# Patient Record
Sex: Female | Born: 1980 | Hispanic: Yes | Marital: Married | Smoking: Never smoker
Health system: Southern US, Community
[De-identification: ages and names within clinical notes are randomized; demographics above are authoritative.]

## PROBLEM LIST (undated history)

## (undated) ENCOUNTER — Inpatient Hospital Stay (HOSPITAL_COMMUNITY): Payer: Self-pay

## (undated) DIAGNOSIS — D649 Anemia, unspecified: Secondary | ICD-10-CM

## (undated) DIAGNOSIS — E119 Type 2 diabetes mellitus without complications: Secondary | ICD-10-CM

---

## 2005-08-17 ENCOUNTER — Ambulatory Visit (HOSPITAL_COMMUNITY): Admission: RE | Admit: 2005-08-17 | Discharge: 2005-08-17 | Payer: Self-pay | Admitting: *Deleted

## 2005-10-11 ENCOUNTER — Ambulatory Visit: Payer: Self-pay | Admitting: Obstetrics & Gynecology

## 2005-10-16 ENCOUNTER — Ambulatory Visit: Payer: Self-pay | Admitting: Family Medicine

## 2005-10-17 ENCOUNTER — Ambulatory Visit: Payer: Self-pay | Admitting: *Deleted

## 2005-10-17 ENCOUNTER — Inpatient Hospital Stay (HOSPITAL_COMMUNITY): Admission: AD | Admit: 2005-10-17 | Discharge: 2005-10-19 | Payer: Self-pay | Admitting: Obstetrics & Gynecology

## 2005-11-02 ENCOUNTER — Ambulatory Visit: Payer: Self-pay | Admitting: Family Medicine

## 2005-11-02 ENCOUNTER — Ambulatory Visit: Admission: RE | Admit: 2005-11-02 | Discharge: 2005-11-02 | Payer: Self-pay | Admitting: Family Medicine

## 2006-11-25 ENCOUNTER — Ambulatory Visit: Payer: Self-pay | Admitting: Family Medicine

## 2006-11-26 ENCOUNTER — Ambulatory Visit: Payer: Self-pay | Admitting: *Deleted

## 2007-01-09 ENCOUNTER — Ambulatory Visit: Payer: Self-pay | Admitting: Family Medicine

## 2007-07-09 ENCOUNTER — Encounter (INDEPENDENT_AMBULATORY_CARE_PROVIDER_SITE_OTHER): Payer: Self-pay | Admitting: *Deleted

## 2007-07-28 ENCOUNTER — Ambulatory Visit: Payer: Self-pay | Admitting: Family Medicine

## 2007-09-08 ENCOUNTER — Ambulatory Visit: Payer: Self-pay | Admitting: Internal Medicine

## 2007-11-04 ENCOUNTER — Inpatient Hospital Stay (HOSPITAL_COMMUNITY): Admission: AD | Admit: 2007-11-04 | Discharge: 2007-11-04 | Payer: Self-pay | Admitting: Obstetrics & Gynecology

## 2007-11-05 ENCOUNTER — Encounter: Payer: Self-pay | Admitting: Obstetrics & Gynecology

## 2007-11-05 ENCOUNTER — Ambulatory Visit (HOSPITAL_COMMUNITY): Admission: AD | Admit: 2007-11-05 | Discharge: 2007-11-05 | Payer: Self-pay | Admitting: Obstetrics & Gynecology

## 2007-11-05 ENCOUNTER — Ambulatory Visit: Payer: Self-pay | Admitting: Obstetrics & Gynecology

## 2007-12-03 ENCOUNTER — Ambulatory Visit: Payer: Self-pay | Admitting: Obstetrics and Gynecology

## 2008-06-25 ENCOUNTER — Ambulatory Visit: Payer: Self-pay | Admitting: Family Medicine

## 2008-09-22 ENCOUNTER — Encounter (INDEPENDENT_AMBULATORY_CARE_PROVIDER_SITE_OTHER): Payer: Self-pay | Admitting: Obstetrics & Gynecology

## 2008-09-22 ENCOUNTER — Ambulatory Visit: Payer: Self-pay | Admitting: Obstetrics and Gynecology

## 2008-10-18 ENCOUNTER — Inpatient Hospital Stay (HOSPITAL_COMMUNITY): Admission: AD | Admit: 2008-10-18 | Discharge: 2008-10-18 | Payer: Self-pay | Admitting: Obstetrics & Gynecology

## 2009-04-06 ENCOUNTER — Ambulatory Visit: Payer: Self-pay | Admitting: Obstetrics and Gynecology

## 2009-04-08 ENCOUNTER — Ambulatory Visit (HOSPITAL_COMMUNITY): Admission: RE | Admit: 2009-04-08 | Discharge: 2009-04-08 | Payer: Self-pay | Admitting: Obstetrics & Gynecology

## 2009-04-21 ENCOUNTER — Ambulatory Visit: Payer: Self-pay | Admitting: Obstetrics and Gynecology

## 2009-10-06 ENCOUNTER — Ambulatory Visit: Payer: Self-pay | Admitting: Obstetrics and Gynecology

## 2010-09-05 ENCOUNTER — Inpatient Hospital Stay (HOSPITAL_COMMUNITY): Admission: AD | Admit: 2010-09-05 | Discharge: 2010-09-05 | Payer: Self-pay | Admitting: Obstetrics

## 2011-02-16 ENCOUNTER — Inpatient Hospital Stay (HOSPITAL_COMMUNITY)
Admission: AD | Admit: 2011-02-16 | Discharge: 2011-02-18 | DRG: 775 | Disposition: A | Discharge status: Home / Self Care | Payer: Medicaid Other | Source: Ambulatory Visit | Attending: Obstetrics | Admitting: Obstetrics

## 2011-02-16 DIAGNOSIS — O34219 Maternal care for unspecified type scar from previous cesarean delivery: Principal | ICD-10-CM | POA: Diagnosis present

## 2011-02-16 LAB — CBC
HCT: 40.8 % (ref 36.0–46.0)
Hemoglobin: 13.7 g/dL (ref 12.0–15.0)
MCH: 28.6 pg (ref 26.0–34.0)
MCHC: 33.6 g/dL (ref 30.0–36.0)
MCV: 85.2 fL (ref 78.0–100.0)
Platelets: 183 10*3/uL (ref 150–400)
RBC: 4.79 MIL/uL (ref 3.87–5.11)
RDW: 15.4 % (ref 11.5–15.5)
WBC: 8.5 10*3/uL (ref 4.0–10.5)

## 2011-02-16 LAB — RPR: RPR Ser Ql: NONREACTIVE

## 2011-02-17 LAB — CBC
HCT: 33.5 % — ABNORMAL LOW (ref 36.0–46.0)
Hemoglobin: 10.8 g/dL — ABNORMAL LOW (ref 12.0–15.0)
MCH: 28 pg (ref 26.0–34.0)
MCHC: 32.2 g/dL (ref 30.0–36.0)
MCV: 86.8 fL (ref 78.0–100.0)
Platelets: 157 10*3/uL (ref 150–400)
RBC: 3.86 MIL/uL — ABNORMAL LOW (ref 3.87–5.11)
RDW: 16.1 % — ABNORMAL HIGH (ref 11.5–15.5)
WBC: 9.5 10*3/uL (ref 4.0–10.5)

## 2011-03-06 NOTE — Group Therapy Note (Signed)
NAME:  Christine Roach, Christine Roach      ACCOUNT NO.:  [id number]   MEDICAL RECORD NO.:  [id number]          PATIENT TYPE:  WOC   LOCATION:  [facility]                   FACILITY:  [facility]   PHYSICIAN:  Johnella Moloney, MD        DATE OF BIRTH:  December 31, 1980   DATE OF SERVICE:  12/03/2007                                  CLINIC NOTE   CHIEF COMPLAINT:  Follow up D&E.   HISTORY OF PRESENT ILLNESS:  This is a 30 year old female, who had a D&E  performed on November 05, 2007, for a known fetal demise incomplete at 12  weeks.  Patient has done well since this time.  She is no longer having  any vaginal bleeding.  She is no longer having abdominal pain.  She  feels that she has recovered completely and has no questions or  concerns.  The patient does also complain of left breast pain.  She says  she feels like there are little bumps on her left breast.  Per her  verbal report, she had a normal mammogram as well as a normal  ultrasound.  The patient states that the pain comes and goes and is not  always present.  No discharge and no redness of the area, she says.  She  is very worried about this.   PHYSICAL EXAMINATION:  VITAL SIGNS:  Temperature 97.7, pulse 71, blood  pressure 113/78, weight is 158.  GENERAL EXAM:  Not in acute distress.  ABDOMINAL EXAM:  Soft and nontender, positive bowel sounds.  BREAST EXAM:  Her left breast has an area of moderate tenderness to  palpation in the inner outer quadrant.  There are no masses, no cysts,  no bumps, and no lesions present.  The area is not indurated nor  fluctuant.  There is no erythema present.  No other areas of tenderness  noted or no other areas of concern.  GU EXAM:  There were no lesions of the vagina or cervix.  There is mild  spotting at the os of the cervix.  Bimanual exam showed a nontender  cervix, a nontender uterus, no masses present, and no adnexal masses  present either.   ASSESSMENT:  This is a 30 year old female for follow up dilation  and  evacuation (D&E) in the office complaining of breast pain.   PLAN:  1. Follow up D&E:  The patient has done well status post her D&E.  She      should refrain from sexual intercourse for an additional two more      weeks for a total of six weeks.  She can follow up p.r.n.  2. Breast pain:  Per verbal report, there was a normal imaging;      however, we do not have these and cannot obtain these records at      this time.  The patient seems to be very worried about this breast      pain and it is present all      through the physical exam.  Therefore, we will refer her to the      [facility] where the surgeons can work up and do any  further imaging and biopsies or studies as needed.      Johney Maine, MD    ______________________________  Johnella Moloney, MD    JT/MEDQ  D:  12/03/2007  T:  12/04/2007  Job:  [id number]

## 2011-03-06 NOTE — Op Note (Signed)
NAME:  Christine Roach, Christine Roach      ACCOUNT NO.:  [id number]   MEDICAL RECORD NO.:  [id number]          PATIENT TYPE:  AMB   LOCATION:  [facility]                          [facility]   PHYSICIAN:  Norton Blizzard, MD    DATE OF BIRTH:  February 08, 1981   DATE OF PROCEDURE:  11/05/2007  DATE OF DISCHARGE:                               OPERATIVE REPORT   PREOPERATIVE DIAGNOSIS:  Known fetal demise at 12 weeks, incomplete  miscarriage.   POSTOPERATIVE DIAGNOSIS:  Known fetal demise at 12 weeks, incomplete  miscarriage.   OPERATION PERFORMED:  Suction dilation and curettage.   SURGEON:  Norton Blizzard, MD   ANESTHESIA:  Spinal.   IV FLUIDS:  800 mL lactated Ringers.   URINE OUTPUT:  Copious amount of clear yellow urine, unmeasured.   ESTIMATED BLOOD LOSS:  200 mL.   INDICATIONS FOR PROCEDURE:  The patient is a 30 year old G3, P2-0-0-2  who initially presented on November 04, 2007 to the [facility] for evaluation of  bleeding in early pregnancy.  The patient underwent an ultrasound which  showed a missed abortion at 12 weeks.  She was counseled regarding her  options.  The patient opted for dilation and evacuation which was  subsequently scheduled on January 15.  At this point the patient had no  bleeding or any further concerns.  However, early on the morning of  November 05, 2007, the patient represented to [facility] with moderate amount of  bleeding.  On exam she has stable vital signs but was noted to have a  moderate amount of products in her vaginal vault and also significant  bleeding.  Given this finding, decision was made to proceed with  dilation and evacuation.  The risks of surgery were explained to the  patient using the aid of a Spanish interpreter and written informed  consent was obtained.   FINDINGS:  A 12 week size uterus.  Moderate amount of products of  conception in patient's vaginal vault and uterine cavity.   COMPLICATIONS:  None.   DESCRIPTION OF PROCEDURE:  The patient  was taken to the operating room  where spinal anesthesia was placed and found to be adequate.  Of note  the patient had received a preoperative antibiotic dose of Doxycycline  100 mg dose intravenously.  The patient was then placed in the dorsal  lithotomy position, and prepped and draped in sterile manner.  A red  rubber catheter was used to catheterize the patient's bladder for a  copious amount of clear yellow urine.  Attention was then turned to the  patient's vagina where a speculum was placed and a paracervical block  was administered using 0.25% Marcaine for a total of 20 mL.  At this  point there was noted to be a moderate amount of products of conception  in the vagina which were removed.  The cervical os was noted to be  dilated to about 1 cm, and a 12 mm suction curette was advanced gently  to the uterine fundus.  The suction device was then activated and the  curette was slowly rotated to clear the contents of the uterine cavity.  Sharp curettage was then done afterwards to confirm complete evacuation.  The suction curette was then inserted one more time to help in clearing  up the remaining clots and debris.  Overall good hemostasis was noted.  All instruments were then removed from the patient's vagina.  The  patient tolerated the procedure well.  Sponge and instrument counts were  correct x 2.  She was taken to the recovery room in stable condition.      Norton Blizzard, MD  Electronically Signed     UAD/MEDQ  D:  11/05/2007  T:  11/05/2007  Job:  [id number]

## 2011-03-06 NOTE — Group Therapy Note (Signed)
NAME:  Christine Roach, Christine Roach      ACCOUNT NO.:  [id number]   MEDICAL RECORD NO.:  [id number]          PATIENT TYPE:  WOC   LOCATION:  [facility]                   [facility]ACILITY:  [facility]   PHYSICIAN:  Dorthula Perfect, MD     DATE OF BIRTH:  [birth year]   DATE OF SERVICE:  09/22/2008                                  CLINIC NOTE   CHIEF COMPLAINT:  Irregular bleeding and possible endocervical polyp  seen on ultrasound.   HISTORY OF PRESENT ILLNESS:  This is a 30 year old female who presents  for the following two problems.  1. Irregular bleeding.  The patient reports that since menarche she      has had irregular periods ranging from 1 every month to 1 every 3      months in an unpredictable fashion.  The periods last 3 days and      are light.  She denies any spotting or bleeding between periods.      She is using condoms for birth control.  She has never used oral      contraceptive pills before.  She was given Provera for 5 days to      induce a withdrawal bleed approximately 3 months ago.  This was      successful and she had a period every 30 days for 2 more cycles,      her last menstrual period being August 10, 2008.  However, she      again has missed her period for November.  A pregnancy test here is      negative.  2. Possible endocervical polyp or fibroids.  In August, the patient      had a transabdominal and transvaginal pelvic ultrasound that showed      a hyperechoic nodular structure, which appears to be arising from      the cervical wall and may be extending within the endocervical      canal measuring 1.6 cm in greatest dimension.  Possibility of      endocervical polyp or fibroid was raised.  The patient was sent for      gynecologic evaluation.   PAST MEDICAL HISTORY:  The patient is a G3, P2-0-1-2.  She had 1 C-  section for her first pregnancy and 1 vaginal delivery.  She had a  missed abortion followed by a D and C in February of 2009.   PAST SURGICAL HISTORY:  1.  C-section.  2. D and C in February 2009 following missed abortion.   CURRENT MEDICATIONS:  None.   ALLERGIES:  No known drug allergies.   PHYSICAL EXAM:  VITAL SIGNS:  Temperature 97.3, pulse 79, blood pressure  114/76.  GENERAL:  The patient is in no acute distress, alert and oriented x3.  PELVIC:  Normal external genitalia, normal vaginal mucosa, pink and  moist without discharge.  Normal-appearing cervix with closed os.  No  cervical lesions.  Uterus is slightly reflexed and of normal size.  No  blood.  No polyp is visualized.  The uterus is enlarged.  The cervix and  uterus are nontender.   ASSESSMENT AND PLAN:  This is a 30 year old female who presents with  dysfunctional uterine bleeding and possible endocervical polyp.   PROBLEMS:  1. Dysfunctional uterine bleeding.  The patient's menstrual cycles      have been irregular since menarche.  She likely has elements of      polycystic ovarian syndrome, indicating she is not regularly      ovulating.  Her possible endocervical polyp is unrelated to her      irregular cycles.  The patient's options of oral contraceptive      pills versus using Provera for withdrawal bleeding every 2 months      is discussed, and the patient would prefer to continue to use      Provera.  She will take 10 mg of Provera for 5 days if she toes 2      months or more without having a menstrual cycle.  She will follow      up in 6 months.  2. Possible endocervical polyp.  Based on ultrasound findings and      normal physical exam, this is unlikely a worrisome finding and I      have decided to follow up in 6 months for repeat exam and possible      repeat ultrasound.  She is to return sooner if she has any pain or      bleeding.  3. Additionally, the patient's Pap smear was performed today.   An interpreter was used for this visit.     ______________________________  Dorthula Perfect, MD    ______________________________  Dorthula Perfect,  MD    ER/MEDQ  D:  09/22/2008  T:  09/22/2008  Job:  [id number]

## 2011-03-09 NOTE — Group Therapy Note (Signed)
NAME:  Christine Roach, FOGLESONG NO.:  [id number]   MEDICAL RECORD NO.:  [id number]          PATIENT TYPE:  WOC   LOCATION:  [facility]                   FACILITY:  [facility]   PHYSICIAN:  Tinnie Gens, MD        DATE OF BIRTH:  01/26/1981   DATE OF SERVICE:                                    CLINIC NOTE   CHIEF COMPLAINT:  Some one called and told me to come in today.   HISTORY OF PRESENT ILLNESS:  Patient is a 30 year old Hispanic patient, who  delivered a female infant on October 17, 2005.  She is nursing, and she said  someone called her on Monday, and told her come in today.  The patient has  complaints of fever, chills, and breast tenderness.  She has got some  erythema on the left breast, and reports that her breasts are achy with, or  without the baby nursing.  She reports that the baby is  feeding about 25  minutes on both sides.  The patient was leaking quite a lot of breast milk.  She thinks she was also here for a Pap smear today.  However, she is only  three weeks postpartum.   PHYSICAL EXAMINATION:  VITAL SIGNS:  She is afebrile.  Temp is 97, blood  pressure is 112/68.  GENERAL: She is a well-developed, well-nourished Hispanic female in no acute  distress.  BREASTS:  Have nipples that are bright pink.  They appear to be __________,  blocked ducts in the left in the outer quadrant.  She also has some streaky  erythema on that left breast as well.  The right breast looks fairly normal.   New exam on the infant, there does appear to be some thrush around the gums  on the inferior surfaces.  The infant was seen by his physician on October 28, 2005.   IMPRESSION:  Mastitis, probable fungal, as well as bacterial.   PLAN:  1.  Keflex 500 mg p.o. t.i.d. x10 days.  2.  Diflucan 100 mg p.o. daily x14 days.  3.  We will have the patient seen by Lactation today at 2:30, and watch her      feed, and see if they can do any correction and aid in her breastfeeding  experience.           ______________________________  Tinnie Gens, MD     TP/MEDQ  D:  11/02/2005  T:  11/03/2005  Job:  [id number]

## 2011-07-12 LAB — CBC
HCT: 38.7
Hemoglobin: 13.5
MCHC: 34.8
MCV: 85.9
Platelets: 254
RBC: 4.5
RDW: 13.9
WBC: 8.1

## 2011-07-12 LAB — URINALYSIS, ROUTINE W REFLEX MICROSCOPIC
Bilirubin Urine: NEGATIVE
Glucose, UA: NEGATIVE
Ketones, ur: NEGATIVE
Leukocytes, UA: NEGATIVE
Nitrite: NEGATIVE
Protein, ur: NEGATIVE
Specific Gravity, Urine: <1.005 — ABNORMAL LOW
Urobilinogen, UA: 0.2
pH: 7.5

## 2011-07-12 LAB — GC/CHLAMYDIA PROBE AMP, GENITAL
Chlamydia, DNA Probe: NEGATIVE
GC Probe Amp, Genital: NEGATIVE

## 2011-07-12 LAB — URINE MICROSCOPIC-ADD ON

## 2011-07-12 LAB — WET PREP, GENITAL
Clue Cells Wet Prep HPF POC: NONE SEEN
Trich, Wet Prep: NONE SEEN
Yeast Wet Prep HPF POC: NONE SEEN

## 2011-07-12 LAB — ABO/RH: ABO/RH(D): O POS

## 2011-07-12 LAB — POCT PREGNANCY, URINE
Operator id: 280671
Preg Test, Ur: POSITIVE

## 2011-07-12 LAB — HCG, QUANTITATIVE, PREGNANCY: hCG, Beta Chain, Quant, S: 1394 — ABNORMAL HIGH

## 2011-07-27 LAB — POCT PREGNANCY, URINE: Preg Test, Ur: NEGATIVE

## 2011-10-23 HISTORY — PX: DILATION AND CURETTAGE OF UTERUS: SHX78

## 2012-05-18 ENCOUNTER — Emergency Department (HOSPITAL_COMMUNITY)
Admission: EM | Admit: 2012-05-18 | Discharge: 2012-05-18 | Disposition: A | Discharge status: Home / Self Care | Payer: Self-pay | Attending: Emergency Medicine | Admitting: Emergency Medicine

## 2012-05-18 ENCOUNTER — Encounter (HOSPITAL_COMMUNITY): Payer: Self-pay | Admitting: *Deleted

## 2012-05-18 ENCOUNTER — Emergency Department (HOSPITAL_COMMUNITY): Payer: Self-pay

## 2012-05-18 DIAGNOSIS — R1013 Epigastric pain: Secondary | ICD-10-CM | POA: Insufficient documentation

## 2012-05-18 DIAGNOSIS — M25519 Pain in unspecified shoulder: Secondary | ICD-10-CM | POA: Insufficient documentation

## 2012-05-18 DIAGNOSIS — O2690 Pregnancy related conditions, unspecified, unspecified trimester: Secondary | ICD-10-CM | POA: Insufficient documentation

## 2012-05-18 DIAGNOSIS — K8020 Calculus of gallbladder without cholecystitis without obstruction: Secondary | ICD-10-CM | POA: Insufficient documentation

## 2012-05-18 DIAGNOSIS — M549 Dorsalgia, unspecified: Secondary | ICD-10-CM

## 2012-05-18 LAB — LIPASE, BLOOD: Lipase: 30 U/L (ref 11–59)

## 2012-05-18 LAB — CBC WITH DIFFERENTIAL/PLATELET
Basophils Absolute: 0 10*3/uL (ref 0.0–0.1)
Basophils Relative: 0 % (ref 0–1)
Eosinophils Absolute: 0.1 10*3/uL (ref 0.0–0.7)
Eosinophils Relative: 1 % (ref 0–5)
HCT: 41.6 % (ref 36.0–46.0)
Hemoglobin: 14.5 g/dL (ref 12.0–15.0)
Lymphocytes Relative: 22 % (ref 12–46)
Lymphs Abs: 2 10*3/uL (ref 0.7–4.0)
MCH: 28.7 pg (ref 26.0–34.0)
MCHC: 34.9 g/dL (ref 30.0–36.0)
MCV: 82.2 fL (ref 78.0–100.0)
Monocytes Absolute: 0.5 10*3/uL (ref 0.1–1.0)
Monocytes Relative: 5 % (ref 3–12)
Neutro Abs: 6.6 10*3/uL (ref 1.7–7.7)
Neutrophils Relative %: 72 % (ref 43–77)
Platelets: 266 10*3/uL (ref 150–400)
RBC: 5.06 MIL/uL (ref 3.87–5.11)
RDW: 13.9 % (ref 11.5–15.5)
WBC: 9.2 10*3/uL (ref 4.0–10.5)

## 2012-05-18 LAB — BASIC METABOLIC PANEL
BUN: 8 mg/dL (ref 6–23)
CO2: 24 mEq/L (ref 19–32)
Calcium: 9.4 mg/dL (ref 8.4–10.5)
Chloride: 99 mEq/L (ref 96–112)
Creatinine, Ser: 0.5 mg/dL (ref 0.50–1.10)
GFR calc Af Amer: >90 mL/min (ref >90)
GFR calc non Af Amer: >90 mL/min (ref >90)
Glucose, Bld: 105 mg/dL — ABNORMAL HIGH (ref 70–99)
Potassium: 3.6 mEq/L (ref 3.5–5.1)
Sodium: 136 mEq/L (ref 135–145)

## 2012-05-18 LAB — HEPATIC FUNCTION PANEL
ALT: 13 U/L (ref 0–35)
Alkaline Phosphatase: 56 U/L (ref 39–117)
Bilirubin, Direct: <0.1 mg/dL (ref 0.0–0.3)
Total Bilirubin: 0.2 mg/dL — ABNORMAL LOW (ref 0.3–1.2)

## 2012-05-18 LAB — URINALYSIS, ROUTINE W REFLEX MICROSCOPIC
Bilirubin Urine: NEGATIVE
Hgb urine dipstick: NEGATIVE
Protein, ur: NEGATIVE mg/dL
Urobilinogen, UA: 0.2 mg/dL (ref 0.0–1.0)

## 2012-05-18 LAB — POCT I-STAT TROPONIN I: Troponin i, poc: 0 ng/mL (ref 0.00–0.08)

## 2012-05-18 LAB — WET PREP, GENITAL
Clue Cells Wet Prep HPF POC: NONE SEEN
Trich, Wet Prep: NONE SEEN

## 2012-05-18 MED ORDER — ONDANSETRON HCL 4 MG/2ML IJ SOLN
4.0000 mg | Freq: Once | INTRAMUSCULAR | Status: AC
  Administered 2012-05-18: 4 mg via INTRAVENOUS
  Filled 2012-05-18: qty 2

## 2012-05-18 MED ORDER — PRENATAL COMPLETE 14-0.4 MG PO TABS: 1.0000 | ORAL_TABLET | Freq: Every day | ORAL | Status: DC

## 2012-05-18 MED ORDER — SODIUM CHLORIDE 0.9 % IV BOLUS (SEPSIS)
1000.0000 mL | Freq: Once | INTRAVENOUS | Status: AC
  Administered 2012-05-18: 1000 mL via INTRAVENOUS

## 2012-05-18 NOTE — ED Notes (Signed)
Patient transported back from ultrasound.

## 2012-05-18 NOTE — ED Provider Notes (Signed)
History     CSN: [id number]  Arrival date & time 05/18/12  0102   First MD Initiated Contact with Patient 05/18/12 0146      Chief Complaint  Patient presents with  . Back Pain    (Consider location/radiation/quality/duration/timing/severity/associated sxs/prior treatment) HPI Comments: Patient presents with right subscapular shoulder pain, right abdominal pain and epigastric pain for the past 3 hours. Associated with nausea. Patient states she is [redacted] weeks pregnant by dates and has not had a confirmatory ultrasound. Denies any fever, chills, vomiting, chest pain or shortness of breath. The pain is underneath her right ribs and anterior sternum and radiates up to her right scapula. She denies any change in bowel habits. No vaginal bleeding, discharge, dysuria or hematuria.  The history is provided by the patient and the spouse.    History reviewed. No pertinent past medical history.  History reviewed. No pertinent past surgical history.  History reviewed. No pertinent family history.  History  Substance Use Topics  . Smoking status: Never Smoker   . Smokeless tobacco: Not on file  . Alcohol Use: No    OB History    Grav Para Term Preterm Abortions TAB SAB Ect Mult Living   1               Review of Systems  Constitutional: Negative for activity change and appetite change.  HENT: Negative for congestion and rhinorrhea.   Respiratory: Negative for cough, chest tightness and shortness of breath.   Cardiovascular: Negative for chest pain.  Gastrointestinal: Positive for nausea and abdominal pain. Negative for vomiting, diarrhea and constipation.  Genitourinary: Negative for dysuria, vaginal bleeding and vaginal discharge.  Musculoskeletal: Positive for back pain.  Skin: Negative for rash.  Neurological: Negative for dizziness, weakness and headaches.    Allergies  Review of patient's allergies indicates no known allergies.  Home Medications   Current Outpatient Rx    Name Route Sig Dispense Refill  . IBUPROFEN 200 MG PO TABS Oral Take 200 mg by mouth every 6 (six) hours as needed. For pain      BP 102/53  Pulse 63  Temp 98.4 F (36.9 C) (Oral)  Resp 18  SpO2 99%  LMP 03/17/2012  Physical Exam  Constitutional: She is oriented to person, place, and time. She appears well-developed and well-nourished. No distress.  HENT:  Head: Normocephalic and atraumatic.  Mouth/Throat: Oropharynx is clear and moist. No oropharyngeal exudate.  Eyes: Conjunctivae and EOM are normal. Pupils are equal, round, and reactive to light.  Neck: Normal range of motion. Neck supple.  Cardiovascular: Normal rate, regular rhythm and normal heart sounds.   Pulmonary/Chest: Effort normal and breath sounds normal. No respiratory distress.  Abdominal: Soft. There is tenderness.       Right upper quadrant epigastric pain  Genitourinary: There is no tenderness on the right labia. There is no tenderness on the left labia. Cervix exhibits no motion tenderness and no discharge. Right adnexum displays no mass and no tenderness. Left adnexum displays no mass and no tenderness. No vaginal discharge found.  Musculoskeletal: Normal range of motion. She exhibits tenderness.       Right-sided thoracic tenderness and subscapular tenderness. Full range of motion of the right shoulder without pain  Neurological: She is alert and oriented to person, place, and time. No cranial nerve deficit.  Skin: Skin is warm.    ED Course  Procedures (including critical care time)  Labs Reviewed  BASIC METABOLIC PANEL - Abnormal; Notable for  the following:    Glucose, Bld 105 (*)     All other components within normal limits  URINALYSIS, ROUTINE W REFLEX MICROSCOPIC - Abnormal; Notable for the following:    APPearance CLOUDY (*)     pH 8.5 (*)     All other components within normal limits  HCG, SERUM, QUALITATIVE - Abnormal; Notable for the following:    Preg, Serum POSITIVE (*)     All other  components within normal limits  PREGNANCY, URINE - Abnormal; Notable for the following:    Preg Test, Ur POSITIVE (*)     All other components within normal limits  HCG, QUANTITATIVE, PREGNANCY - Abnormal; Notable for the following:    hCG, Beta Chain, Quant, [place] [address] (*)     All other components within normal limits  HEPATIC FUNCTION PANEL - Abnormal; Notable for the following:    Total Bilirubin 0.2 (*)     All other components within normal limits  CBC WITH DIFFERENTIAL  POCT I-STAT TROPONIN I  LIPASE, BLOOD  ABO/RH  D-DIMER, QUANTITATIVE  GC/CHLAMYDIA PROBE AMP, GENITAL  WET PREP, GENITAL   US Abdomen Complete  05/18/2012  *RADIOLOGY REPORT*  Clinical Data:  Right upper quadrant pain.  COMPLETE ABDOMINAL ULTRASOUND  Comparison:  None.  Findings:  Gallbladder:  Cholelithiasis.  No gallbladder wall thickening or pericholecystic fluid.  Negative sonographic Murphy's sign.  Common bile duct:  Measures 3 mm.  Within normal limits.  Liver:  Mild heterogeneous echogenicity without focal abnormality identified.  IVC:  Appears normal.  Pancreas:  Poorly visualized due to limited acoustic windows.  No focal abnormality identified within the head/neck.  The body and tail are obscured.  Spleen:  Measures 6.4 cm oblique.  No focal abnormality.  Right Kidney:  Measures 12.6 cm.  No hydronephrosis or focal abnormality.  Left Kidney:  Measures 13.0 cm.  No hydronephrosis or focal abnormality.  Abdominal aorta:  Proximal aorta and bifurcation are obscured by overlying artifact.  Where seen, measures up to 1.7 cm.  IMPRESSION: Cholelithiasis without sonographic evidence for cholecystitis.  Heterogeneous liver echogenicity suggests steatosis.  Original Report Authenticated By: Waneta Martins, M.D.   US Ob Comp Less 14 Wks  05/18/2012  *RADIOLOGY REPORT*  Clinical Data: Pregnant, abdominal pain.  OBSTETRIC <14 WK [place] AND TRANSVAGINAL OB US  Technique:  Both transabdominal and transvaginal ultrasound  examinations were performed for complete evaluation of the gestation as well as the maternal uterus, adnexal regions, and pelvic cul-de-sac.  Transvaginal technique was performed to assess early pregnancy.  Comparison:  None.  Intrauterine gestational sac:  Visualized/normal in shape. Yolk sac: Identified Embryo: Identified Cardiac Activity: Identified Heart Rate: 158 bpm  CRL: 15  mm  7 w  6 d         [place] EDC: 12/29/2012  Maternal uterus/adnexae: No subchorionic hemorrhage.  Left ovary not visualized.  Numerous small cysts along the periphery of the right ovary, in a pattern that can be seen with polycystic ovarian syndrome.  IMPRESSION: Single intrauterine gestation with cardiac activity documented. Estimated age of 7 weeks 6 days by crown-rump length.  Multiple small follicles along the periphery of the right ovary is nonspecific, though can be seen with polycystic ovarian syndrome.  Original Report Authenticated By: Waneta Martins, M.D.   US Ob Transvaginal  05/18/2012  *RADIOLOGY REPORT*  Clinical Data: Pregnant, abdominal pain.  OBSTETRIC <14 WK [place] AND TRANSVAGINAL OB US  Technique:  Both transabdominal and transvaginal ultrasound examinations were performed  for complete evaluation of the gestation as well as the maternal uterus, adnexal regions, and pelvic cul-de-sac.  Transvaginal technique was performed to assess early pregnancy.  Comparison:  None.  Intrauterine gestational sac:  Visualized/normal in shape. Yolk sac: Identified Embryo: Identified Cardiac Activity: Identified Heart Rate: 158 bpm  CRL: 15  mm  7 w  6 d         [place] EDC: 12/29/2012  Maternal uterus/adnexae: No subchorionic hemorrhage.  Left ovary not visualized.  Numerous small cysts along the periphery of the right ovary, in a pattern that can be seen with polycystic ovarian syndrome.  IMPRESSION: Single intrauterine gestation with cardiac activity documented. Estimated age of 7 weeks 6 days by crown-rump length.  Multiple small  follicles along the periphery of the right ovary is nonspecific, though can be seen with polycystic ovarian syndrome.  Original Report Authenticated By: Waneta Martins, M.D.     No diagnosis found.    MDM  Patient arrives with right shoulder pain, right upper quadrant pain and epigastric pain for the past 3 hours. She is [redacted] weeks pregnant by date. She admits to nausea.  Concern for gallbladder pathology. Less likely PE.  No tachycardia or hypoxia.    Cholelithiasis without cholecystitis on ultrasound. LFTs and lipase within normal limits. IUP confirmed on ultrasound with appropriate fetal heart tones.  D-dimer negative. No tachycardia, no hypoxia to suggest pulmonary embolus. Back pain as well as abdominal pain completely resolved during ED stay. Followup with women's clinic.  Prenatal vitamins started.  Patient advised to discontinue any NSAID use.   Date: 05/18/2012  Rate: 67  Rhythm: normal sinus rhythm  QRS Axis: normal  Intervals: normal  ST/T Wave abnormalities: normal  Conduction Disutrbances:none  Narrative Interpretation:   Old EKG Reviewed: none available    Glynn Octave, MD 05/18/12 [id number]

## 2012-05-18 NOTE — ED Notes (Signed)
Pt states that she has upper back pain. Pt states the pain started an hour ago. Pt states upper abdominal pain. Pt is approx. [redacted] weeks pregnant. Pt states that she took ibuprofen for the pain with no relief. Pt states nauseated.

## 2012-05-20 LAB — GC/CHLAMYDIA PROBE AMP, GENITAL
Chlamydia, DNA Probe: NEGATIVE
GC Probe Amp, Genital: NEGATIVE

## 2012-07-08 ENCOUNTER — Ambulatory Visit (HOSPITAL_COMMUNITY)
Admission: RE | Admit: 2012-07-08 | Discharge: 2012-07-08 | Disposition: A | Discharge status: Home / Self Care | Payer: Self-pay | Source: Ambulatory Visit | Attending: Obstetrics | Admitting: Obstetrics

## 2012-07-08 ENCOUNTER — Other Ambulatory Visit (HOSPITAL_COMMUNITY): Payer: Self-pay | Admitting: Obstetrics

## 2012-07-08 ENCOUNTER — Ambulatory Visit (HOSPITAL_COMMUNITY): Payer: Self-pay

## 2012-07-08 DIAGNOSIS — O368390 Maternal care for abnormalities of the fetal heart rate or rhythm, unspecified trimester, not applicable or unspecified: Secondary | ICD-10-CM | POA: Insufficient documentation

## 2012-07-08 DIAGNOSIS — O3680X0 Pregnancy with inconclusive fetal viability, not applicable or unspecified: Secondary | ICD-10-CM

## 2012-07-09 ENCOUNTER — Inpatient Hospital Stay (HOSPITAL_COMMUNITY)
Admission: AD | Admit: 2012-07-09 | Discharge: 2012-07-09 | DRG: 779 | Disposition: A | Discharge status: Home / Self Care | Payer: Medicaid Other | Source: Ambulatory Visit | Attending: Obstetrics | Admitting: Obstetrics

## 2012-07-09 DIAGNOSIS — O021 Missed abortion: Principal | ICD-10-CM | POA: Diagnosis present

## 2012-07-09 LAB — MRSA PCR SCREENING: MRSA by PCR: NEGATIVE

## 2012-07-09 LAB — CBC
Hemoglobin: 12.9 g/dL (ref 12.0–15.0)
MCHC: 34 g/dL (ref 30.0–36.0)
RDW: 14.8 % (ref 11.5–15.5)

## 2012-07-09 MED ORDER — SODIUM CHLORIDE 0.9 % IJ SOLN: 9.0000 mL | INTRAMUSCULAR | Status: DC | PRN

## 2012-07-09 MED ORDER — MORPHINE SULFATE (PF) 1 MG/ML IV SOLN
INTRAVENOUS | Status: DC
  Administered 2012-07-09: 11:00:00 via INTRAVENOUS
  Administered 2012-07-09: 3 mg via INTRAVENOUS
  Administered 2012-07-09: 10.5 mg via INTRAVENOUS
  Filled 2012-07-09: qty 25

## 2012-07-09 MED ORDER — DIPHENHYDRAMINE HCL 50 MG/ML IJ SOLN: 12.5000 mg | Freq: Four times a day (QID) | INTRAMUSCULAR | Status: DC | PRN

## 2012-07-09 MED ORDER — MISOPROSTOL 200 MCG PO TABS
800.0000 ug | ORAL_TABLET | Freq: Once | ORAL | Status: AC
  Administered 2012-07-09: 800 ug via RECTAL

## 2012-07-09 MED ORDER — MISOPROSTOL 200 MCG PO TABS
600.0000 ug | ORAL_TABLET | Freq: Four times a day (QID) | ORAL | Status: DC
  Administered 2012-07-09: 600 ug via VAGINAL
  Filled 2012-07-09: qty 4
  Filled 2012-07-09: qty 3

## 2012-07-09 MED ORDER — DIPHENHYDRAMINE HCL 12.5 MG/5ML PO ELIX
12.5000 mg | ORAL_SOLUTION | Freq: Four times a day (QID) | ORAL | Status: DC | PRN
  Filled 2012-07-09: qty 5

## 2012-07-09 MED ORDER — LACTATED RINGERS IV SOLN
INTRAVENOUS | Status: DC
  Administered 2012-07-09: 10:00:00 via INTRAVENOUS

## 2012-07-09 MED ORDER — ONDANSETRON HCL 4 MG/2ML IJ SOLN: 4.0000 mg | Freq: Four times a day (QID) | INTRAMUSCULAR | Status: DC | PRN

## 2012-07-09 MED ORDER — NALOXONE HCL 0.4 MG/ML IJ SOLN: 0.4000 mg | INTRAMUSCULAR | Status: DC | PRN

## 2012-07-09 NOTE — H&P (Signed)
This is Dr. Francoise Ceo dictating the history and physical on  Christine Roach she's a 31 year old gravida 5 para 3013 she had one C-section in the past and her EDC by ultrasound 12/29/2012 making her [redacted] weeks pregnant she was first seen on 06/11/2011 weeks and at that time with a fetal heart 160 yesterday no fetal heart was noted on ultrasound confirmed an IUFD at 15 weeks lap medium-sized laminae Y. Was inserted in her cervix and that was replaced removed this morning Past medical history negative Past surgical history C-section x1 Social history negative System review noncontributory Physical exam revealed a well-developed female in no distress HEENT negative Breasts negative Heart regular rhythm no murmurs or gallops Lungs clear to P&A Abdomen negative uterus 16 weeks size and Cervix slightly old him to laminae rose removed Extremities negative and

## 2012-07-09 NOTE — Discharge Summary (Signed)
  Patient was admitted this morning at 16 weeks with intrauterine fetal demise and had a him in RES today this was removed and 600 mics of Cytotec was inserted vagina she passed the fetus at 1 PM and and 800 mics of Cytotec placed per rectum and she passed the placenta at 1:45 PM her bleeding is minimal no pain and she been discharged today

## 2012-07-09 NOTE — Progress Notes (Signed)
07/09/12 1100  Clinical Encounter Type  Visited With Patient and family together Armed forces training and education officer, interpreter)  Visit Type Spiritual support;Social support  Referral From Nurse [facility] Johnnye Sima)  Spiritual Encounters  Spiritual Needs Emotional;Grief support  Stress Factors  Patient Stress Factors Loss (miscarriage)    With interpreter Eda's assistance, met Christine Roach and her husband to introduce myself and to offer condolences and chaplain support as desired through their stay here.  They were quiet and also appreciative.  Assisted Eda and RN Sharyl Nimrod with initial calls re cremation service per pt request.  Assembled Comfort materials for nurse to provide to family as appropriate.  Family did not desire lengthy conversation at this time, but is aware of ongoing chaplain availability.  [address] [place], [place] [phone]

## 2012-07-09 NOTE — Progress Notes (Signed)
Pt. Delivered products of conception at 1300. Dr. Gaynell Face, interpreter, and chaplain notified. After Dr. Gaynell Face performed his exam, I administered of Cytotec rectally per MD order. Pt. Delivered placenta at 1345. Several clots were present. Fundus is midline, firm and 3 below the umbilicus. Emotional support provided to pt. And her husband. Pt. Held the baby for awhile, and was emotional and appropriate. Pt. And husband declined having pictures taken and footprints taken of the baby. Will provide resources. Plan for family to pick up baby tomorrow to take to a funeral. Pt. Wants to go home today. Dr. Gaynell Face stated he would come evaluate pt. This afternoon. Will continue to monitor.

## 2012-07-09 NOTE — Progress Notes (Signed)
Pt. Is stable and ready to discharge home. Discharge instructions were given via interpreter and pt. Verbalized understanding of instructions. Pt. Was walked out by RN and husband took her home. All of belongings and RX was given to her.

## 2012-07-09 NOTE — Progress Notes (Signed)
Patienpatient passed fetus at 1 PM and received 800 mics of Cytotec per rectum and passed the placenta at 8 at 1:45 PM there is no minimal bleeding and the patient will be discharged todayt ID: Christine Roach, female   DOB: 01/23/1981, 31 y.o.   MRN: [id number]

## 2012-07-10 NOTE — Progress Notes (Signed)
Post discharge chart review completed.  

## 2012-08-23 ENCOUNTER — Emergency Department (HOSPITAL_COMMUNITY)
Admission: EM | Admit: 2012-08-23 | Discharge: 2012-08-23 | Disposition: A | Discharge status: Home / Self Care | Payer: Self-pay | Attending: Emergency Medicine | Admitting: Emergency Medicine

## 2012-08-23 ENCOUNTER — Emergency Department (HOSPITAL_COMMUNITY): Payer: Self-pay

## 2012-08-23 ENCOUNTER — Encounter (HOSPITAL_COMMUNITY): Payer: Self-pay | Admitting: *Deleted

## 2012-08-23 DIAGNOSIS — K8050 Calculus of bile duct without cholangitis or cholecystitis without obstruction: Secondary | ICD-10-CM

## 2012-08-23 DIAGNOSIS — K8020 Calculus of gallbladder without cholecystitis without obstruction: Secondary | ICD-10-CM | POA: Insufficient documentation

## 2012-08-23 DIAGNOSIS — R1110 Vomiting, unspecified: Secondary | ICD-10-CM | POA: Insufficient documentation

## 2012-08-23 LAB — LIPASE, BLOOD: Lipase: 28 U/L (ref 11–59)

## 2012-08-23 LAB — CBC WITH DIFFERENTIAL/PLATELET
Basophils Absolute: 0 10*3/uL (ref 0.0–0.1)
Basophils Relative: 0 % (ref 0–1)
MCHC: 34.6 g/dL (ref 30.0–36.0)
Neutro Abs: 6.9 10*3/uL (ref 1.7–7.7)
Neutrophils Relative %: 77 % (ref 43–77)
Platelets: 283 10*3/uL (ref 150–400)
RDW: 13.5 % (ref 11.5–15.5)

## 2012-08-23 LAB — COMPREHENSIVE METABOLIC PANEL
AST: 15 U/L (ref 0–37)
Albumin: 4 g/dL (ref 3.5–5.2)
Alkaline Phosphatase: 63 U/L (ref 39–117)
Chloride: 100 mEq/L (ref 96–112)
Potassium: 3.8 mEq/L (ref 3.5–5.1)
Total Bilirubin: 0.4 mg/dL (ref 0.3–1.2)

## 2012-08-23 MED ORDER — PROMETHAZINE HCL 25 MG PO TABS: 25.0000 mg | ORAL_TABLET | Freq: Four times a day (QID) | ORAL | Status: DC | PRN

## 2012-08-23 MED ORDER — SODIUM CHLORIDE 0.9 % IV SOLN
Freq: Once | INTRAVENOUS | Status: AC
  Administered 2012-08-23: 08:00:00 via INTRAVENOUS

## 2012-08-23 MED ORDER — MORPHINE SULFATE 4 MG/ML IJ SOLN
4.0000 mg | Freq: Once | INTRAMUSCULAR | Status: AC
  Administered 2012-08-23: 4 mg via INTRAVENOUS
  Filled 2012-08-23: qty 1

## 2012-08-23 MED ORDER — OXYCODONE-ACETAMINOPHEN 5-325 MG PO TABS: 1.0000 | ORAL_TABLET | Freq: Four times a day (QID) | ORAL | Status: DC | PRN

## 2012-08-23 MED ORDER — ONDANSETRON HCL 4 MG/2ML IJ SOLN
4.0000 mg | Freq: Once | INTRAMUSCULAR | Status: AC
  Administered 2012-08-23: 4 mg via INTRAVENOUS
  Filled 2012-08-23: qty 2

## 2012-08-23 NOTE — ED Provider Notes (Signed)
History     CSN: [id number]  Arrival date & time 08/23/12  4098   First MD Initiated Contact with Patient 08/23/12 [phone]      Chief Complaint  Patient presents with  . Back Pain  . Emesis    (Consider location/radiation/quality/duration/timing/severity/associated sxs/prior treatment) HPI Comments: Patient comes in with a chief complaint of abdominal pain.  Pain located in the RUQ area and radiates to her right scapula region.  Pain began approximately seven hours prior to arrival in the ED and has been constant since that time.  She reports that she has had similar pain intermittently over the past 4 months. She also had several episodes of vomiting this morning.  She reports that her emesis appears as a yellowish liquid.  She was seen for similar symptoms in the ED on 05/18/12.  At that time an ultrasound was done, which showed Gallstones, but no evidence of Cholecystitis.   She reports that she was pregnant but had a miscarriage approximately one month ago.  She has seen OB/GYN regarding this.  Patient is a 31 y.o. female presenting with vomiting. The history is provided by the patient. The history is limited by a language barrier. A language interpreter was used (phone interpretor used).  Emesis  Associated symptoms include abdominal pain and chills. Pertinent negatives include no diarrhea and no fever.    History reviewed. No pertinent past medical history.  Past Surgical History  Procedure Date  . Cesarian     No family history on file.  History  Substance Use Topics  . Smoking status: Never Smoker   . Smokeless tobacco: Not on file  . Alcohol Use: No    OB History    Grav Para Term Preterm Abortions TAB SAB Ect Mult Living   1               Review of Systems  Constitutional: Positive for chills. Negative for fever and appetite change.  Respiratory: Negative for shortness of breath.   Gastrointestinal: Positive for nausea, vomiting and abdominal pain. Negative for  diarrhea, constipation and abdominal distention.  Genitourinary: Negative for urgency, frequency, vaginal bleeding and vaginal discharge.    Allergies  Review of patient's allergies indicates no known allergies.  Home Medications   Current Outpatient Rx  Name Route Sig Dispense Refill  . PRENATAL MULTIVITAMIN CH Oral Take 1 tablet by mouth daily.      BP 125/76  Pulse 72  Temp 98.7 F (37.1 C) (Oral)  Resp 20  SpO2 100%  LMP 03/17/2012  Physical Exam  Nursing note and vitals reviewed. Constitutional: She appears well-developed and well-nourished. No distress.  HENT:  Head: Normocephalic and atraumatic.  Mouth/Throat: Oropharynx is clear and moist.  Cardiovascular: Normal rate, regular rhythm and normal heart sounds.   Pulmonary/Chest: Effort normal and breath sounds normal. No respiratory distress. She has no wheezes. She has no rales.  Abdominal: Soft. Bowel sounds are normal. She exhibits no distension and no mass. There is tenderness in the right upper quadrant. There is positive Murphy's sign. There is no rebound and no guarding.  Neurological: She is alert.  Skin: Skin is warm and dry. She is not diaphoretic.  Psychiatric: She has a normal mood and affect.    ED Course  Procedures (including critical care time)   Labs Reviewed  CBC WITH DIFFERENTIAL  LIPASE, BLOOD  COMPREHENSIVE METABOLIC PANEL   US Abdomen Complete  08/23/2012  *RADIOLOGY REPORT*  Clinical Data:  Right upper quadrant abdominal  pain.  Nausea vomiting.  ABDOMINAL ULTRASOUND COMPLETE  Comparison:  05/18/2012  Findings:  Gallbladder:  Multiple gallstones are again seen, largest measuring 1.3 cm.  No evidence of gallbladder dilatation, wall thickening, or pericholecystic fluid.  Common Bile Duct:  Within normal limits in caliber. Measures 5 mm in diameter.  Liver: No focal mass lesion identified.  Within normal limits in parenchymal echogenicity.  IVC:  Appears normal.  Pancreas:  No abnormality  identified.  Spleen:  Within normal limits in size and echotexture.  Right kidney:  Normal in size and parenchymal echogenicity.  No evidence of mass or hydronephrosis.  Left kidney:  Normal in size and parenchymal echogenicity.  No evidence of mass or hydronephrosis.  Abdominal Aorta:  No aneurysm identified.  IMPRESSION:  Cholelithiasis again noted.  No sonographic signs of acute cholecystitis or other acute findings.   Original Report Authenticated By: Myles Rosenthal, M.D.      1. Gallstones   2. Biliary colic       MDM  Patient presents today with RUQ pain radiating to her back.  Pain associated with nausea and vomiting. Ultrasound showing Gallstones, but no evidence of Cholecystitis.   Patient is afebrile.  Labs unremarkable.  WBC within normal limits.  Normal Lipase.  Normal LFT's.  Pain and nausea controlled after one dose of Morphine and Zofran.  Therefore, feel that patient can be discharged home with General Surgery follow up outpatient.  Return precautions and discharge instructions discussed with patient using the interpretor phone.  All questions answered.         Pascal Lux [place], PA-C 08/23/12 [phone]

## 2012-08-23 NOTE — ED Notes (Signed)
PT to ED c/o back pain (below the R scapula) that began last night.  Pain radiates to RUQ.  Pt with emesis that is bile-like.  Pt was seen here several months ago for same symptoms, but was told there was nothing wrong.  Pt is wearing a heat patch on her back which, per husband, helps when she has the pain every 15 days, but did not help last night.

## 2012-08-23 NOTE — ED Notes (Signed)
Patient transported to Ultrasound 

## 2012-08-25 ENCOUNTER — Encounter (INDEPENDENT_AMBULATORY_CARE_PROVIDER_SITE_OTHER): Payer: Self-pay | Admitting: General Surgery

## 2012-08-25 ENCOUNTER — Encounter (HOSPITAL_COMMUNITY): Payer: Self-pay | Admitting: Certified Registered"

## 2012-08-25 ENCOUNTER — Inpatient Hospital Stay (HOSPITAL_COMMUNITY): Payer: MEDICAID

## 2012-08-25 ENCOUNTER — Inpatient Hospital Stay (HOSPITAL_COMMUNITY): Payer: MEDICAID | Admitting: Certified Registered"

## 2012-08-25 ENCOUNTER — Ambulatory Visit (INDEPENDENT_AMBULATORY_CARE_PROVIDER_SITE_OTHER): Payer: Self-pay | Admitting: General Surgery

## 2012-08-25 ENCOUNTER — Encounter (HOSPITAL_COMMUNITY): Admission: AD | Disposition: A | Discharge status: Home / Self Care | Payer: Self-pay | Source: Ambulatory Visit

## 2012-08-25 ENCOUNTER — Observation Stay (HOSPITAL_COMMUNITY)
Admission: AD | Admit: 2012-08-25 | Discharge: 2012-08-27 | Disposition: A | Discharge status: Home / Self Care | Payer: MEDICAID | Source: Ambulatory Visit | Attending: Surgery | Admitting: Surgery

## 2012-08-25 ENCOUNTER — Encounter (HOSPITAL_COMMUNITY): Payer: Self-pay | Admitting: Radiology

## 2012-08-25 DIAGNOSIS — K810 Acute cholecystitis: Secondary | ICD-10-CM | POA: Diagnosis present

## 2012-08-25 DIAGNOSIS — K8000 Calculus of gallbladder with acute cholecystitis without obstruction: Principal | ICD-10-CM | POA: Insufficient documentation

## 2012-08-25 DIAGNOSIS — K8066 Calculus of gallbladder and bile duct with acute and chronic cholecystitis without obstruction: Secondary | ICD-10-CM

## 2012-08-25 HISTORY — PX: CHOLECYSTECTOMY: SHX55

## 2012-08-25 LAB — URINALYSIS, ROUTINE W REFLEX MICROSCOPIC
Glucose, UA: NEGATIVE mg/dL
Leukocytes, UA: NEGATIVE
Nitrite: NEGATIVE
Protein, ur: NEGATIVE mg/dL
Urobilinogen, UA: 1 mg/dL (ref 0.0–1.0)

## 2012-08-25 LAB — COMPREHENSIVE METABOLIC PANEL
Albumin: 4 g/dL (ref 3.5–5.2)
BUN: 6 mg/dL (ref 6–23)
Chloride: 101 mEq/L (ref 96–112)
Creatinine, Ser: 0.58 mg/dL (ref 0.50–1.10)
GFR calc Af Amer: >90 mL/min (ref >90)
Glucose, Bld: 91 mg/dL (ref 70–99)
Total Bilirubin: 0.6 mg/dL (ref 0.3–1.2)

## 2012-08-25 LAB — CBC
HCT: 42.2 % (ref 36.0–46.0)
Hemoglobin: 14.3 g/dL (ref 12.0–15.0)
MCHC: 33.9 g/dL (ref 30.0–36.0)
MCV: 83.1 fL (ref 78.0–100.0)
RDW: 13.5 % (ref 11.5–15.5)
WBC: 9.5 10*3/uL (ref 4.0–10.5)

## 2012-08-25 LAB — MRSA PCR SCREENING: MRSA by PCR: NEGATIVE

## 2012-08-25 SURGERY — LAPAROSCOPIC CHOLECYSTECTOMY WITH INTRAOPERATIVE CHOLANGIOGRAM
Anesthesia: General | Site: Abdomen | Wound class: Clean Contaminated

## 2012-08-25 MED ORDER — LIDOCAINE HCL (CARDIAC) 20 MG/ML IV SOLN
INTRAVENOUS | Status: DC | PRN
  Administered 2012-08-25: 80 mg via INTRAVENOUS

## 2012-08-25 MED ORDER — SODIUM CHLORIDE 0.9 % IR SOLN
Status: DC | PRN
  Administered 2012-08-25 (×2): 1000 mL

## 2012-08-25 MED ORDER — LACTATED RINGERS IV SOLN
INTRAVENOUS | Status: DC | PRN
  Administered 2012-08-25 (×2): via INTRAVENOUS

## 2012-08-25 MED ORDER — PROPOFOL 10 MG/ML IV BOLUS
INTRAVENOUS | Status: DC | PRN
  Administered 2012-08-25: 200 mg via INTRAVENOUS

## 2012-08-25 MED ORDER — FENTANYL CITRATE 0.05 MG/ML IJ SOLN
INTRAMUSCULAR | Status: DC | PRN
  Administered 2012-08-25 (×5): 100 ug via INTRAVENOUS

## 2012-08-25 MED ORDER — ONDANSETRON HCL 4 MG/2ML IJ SOLN: 4.0000 mg | Freq: Four times a day (QID) | INTRAMUSCULAR | Status: DC | PRN

## 2012-08-25 MED ORDER — SODIUM CHLORIDE 0.9 % IV SOLN
INTRAVENOUS | Status: DC | PRN
  Administered 2012-08-25: 14:00:00

## 2012-08-25 MED ORDER — AMPICILLIN-SULBACTAM SODIUM 3 (2-1) G IJ SOLR
3.0000 g | INTRAMUSCULAR | Status: DC
  Filled 2012-08-25: qty 3

## 2012-08-25 MED ORDER — NEOSTIGMINE METHYLSULFATE 1 MG/ML IJ SOLN
INTRAMUSCULAR | Status: DC | PRN
  Administered 2012-08-25: 2 mg via INTRAVENOUS

## 2012-08-25 MED ORDER — ONDANSETRON HCL 4 MG/2ML IJ SOLN
INTRAMUSCULAR | Status: DC | PRN
  Administered 2012-08-25: 4 mg via INTRAVENOUS

## 2012-08-25 MED ORDER — MORPHINE SULFATE 2 MG/ML IJ SOLN
2.0000 mg | INTRAMUSCULAR | Status: DC | PRN
  Administered 2012-08-25: 4 mg via INTRAVENOUS
  Filled 2012-08-25: qty 2

## 2012-08-25 MED ORDER — PROMETHAZINE HCL 25 MG/ML IJ SOLN: 12.5000 mg | Freq: Four times a day (QID) | INTRAMUSCULAR | Status: DC | PRN

## 2012-08-25 MED ORDER — DEXAMETHASONE SODIUM PHOSPHATE 4 MG/ML IJ SOLN
INTRAMUSCULAR | Status: DC | PRN
  Administered 2012-08-25: 8 mg via INTRAVENOUS

## 2012-08-25 MED ORDER — LIDOCAINE HCL 4 % MT SOLN
OROMUCOSAL | Status: DC | PRN
  Administered 2012-08-25: 4 mL via TOPICAL

## 2012-08-25 MED ORDER — MIDAZOLAM HCL 5 MG/5ML IJ SOLN
INTRAMUSCULAR | Status: DC | PRN
  Administered 2012-08-25: 2 mg via INTRAVENOUS

## 2012-08-25 MED ORDER — KCL IN DEXTROSE-NACL 20-5-0.9 MEQ/L-%-% IV SOLN
INTRAVENOUS | Status: DC
  Filled 2012-08-25 (×2): qty 1000

## 2012-08-25 MED ORDER — BUPIVACAINE-EPINEPHRINE 0.25% -1:200000 IJ SOLN
INTRAMUSCULAR | Status: DC | PRN
  Administered 2012-08-25: 28 mL

## 2012-08-25 MED ORDER — GLYCOPYRROLATE 0.2 MG/ML IJ SOLN
INTRAMUSCULAR | Status: DC | PRN
  Administered 2012-08-25: 0.4 mg via INTRAVENOUS

## 2012-08-25 MED ORDER — HYDROMORPHONE HCL PF 1 MG/ML IJ SOLN
INTRAMUSCULAR | Status: AC
  Administered 2012-08-25: 0.5 mg via INTRAVENOUS
  Filled 2012-08-25: qty 1

## 2012-08-25 MED ORDER — SODIUM CHLORIDE 0.9 % IV SOLN
3.0000 g | Freq: Four times a day (QID) | INTRAVENOUS | Status: DC
  Administered 2012-08-25 – 2012-08-26 (×4): 3 g via INTRAVENOUS
  Filled 2012-08-25 (×5): qty 3

## 2012-08-25 MED ORDER — MORPHINE SULFATE 2 MG/ML IJ SOLN
1.0000 mg | INTRAMUSCULAR | Status: DC | PRN
  Administered 2012-08-25 – 2012-08-26 (×4): 2 mg via INTRAVENOUS
  Filled 2012-08-25 (×4): qty 1

## 2012-08-25 MED ORDER — HYDROMORPHONE HCL PF 1 MG/ML IJ SOLN
0.2500 mg | INTRAMUSCULAR | Status: DC | PRN
  Administered 2012-08-25 (×3): 0.5 mg via INTRAVENOUS

## 2012-08-25 MED ORDER — POTASSIUM CHLORIDE IN NACL 20-0.9 MEQ/L-% IV SOLN
INTRAVENOUS | Status: DC
  Administered 2012-08-25: 19:00:00 via INTRAVENOUS
  Filled 2012-08-25 (×3): qty 1000

## 2012-08-25 MED ORDER — BUPIVACAINE-EPINEPHRINE PF 0.25-1:200000 % IJ SOLN
INTRAMUSCULAR | Status: AC
  Filled 2012-08-25: qty 30

## 2012-08-25 MED ORDER — ROCURONIUM BROMIDE 100 MG/10ML IV SOLN
INTRAVENOUS | Status: DC | PRN
  Administered 2012-08-25: 30 mg via INTRAVENOUS

## 2012-08-25 MED ORDER — ONDANSETRON HCL 4 MG PO TABS: 4.0000 mg | ORAL_TABLET | Freq: Four times a day (QID) | ORAL | Status: DC | PRN

## 2012-08-25 MED ORDER — DIPHENHYDRAMINE HCL 25 MG PO CAPS: 25.0000 mg | ORAL_CAPSULE | Freq: Four times a day (QID) | ORAL | Status: DC | PRN

## 2012-08-25 MED ORDER — LACTATED RINGERS IV SOLN
INTRAVENOUS | Status: DC
  Administered 2012-08-25: 14:00:00 via INTRAVENOUS

## 2012-08-25 MED ORDER — SODIUM CHLORIDE 0.9 % IV SOLN
3.0000 g | Freq: Four times a day (QID) | INTRAVENOUS | Status: DC
  Administered 2012-08-25 – 2012-08-27 (×7): 3 g via INTRAVENOUS
  Filled 2012-08-25 (×9): qty 3

## 2012-08-25 MED ORDER — HYDROCODONE-ACETAMINOPHEN 5-325 MG PO TABS
1.0000 | ORAL_TABLET | ORAL | Status: DC | PRN
  Administered 2012-08-26 – 2012-08-27 (×4): 2 via ORAL
  Filled 2012-08-25 (×4): qty 2

## 2012-08-25 MED ORDER — ONDANSETRON HCL 4 MG/2ML IJ SOLN: 4.0000 mg | Freq: Once | INTRAMUSCULAR | Status: DC | PRN

## 2012-08-25 MED ORDER — DIPHENHYDRAMINE HCL 50 MG/ML IJ SOLN: 12.5000 mg | Freq: Four times a day (QID) | INTRAMUSCULAR | Status: DC | PRN

## 2012-08-25 SURGICAL SUPPLY — 48 items
APPLIER CLIP ROT 10 11.4 M/L (STAPLE) ×2
APR CLP MED LRG 11.4X10 (STAPLE) ×1
BAG SPEC RTRVL LRG 6X4 10 (ENDOMECHANICALS) ×1
BLADE SURG ROTATE 9660 (MISCELLANEOUS) ×1 IMPLANT
CANISTER SUCTION 2500CC (MISCELLANEOUS) ×2 IMPLANT
CLIP APPLIE ROT 10 11.4 M/L (STAPLE) ×1 IMPLANT
CLOTH BEACON ORANGE TIMEOUT ST (SAFETY) ×2 IMPLANT
COVER MAYO STAND STRL (DRAPES) ×2 IMPLANT
COVER SURGICAL LIGHT HANDLE (MISCELLANEOUS) ×2 IMPLANT
DECANTER SPIKE VIAL GLASS SM (MISCELLANEOUS) ×2 IMPLANT
DRAPE C-ARM 42X72 X-RAY (DRAPES) ×2 IMPLANT
DRAPE WARM FLUID 44X44 (DRAPE) ×2 IMPLANT
DRSG TEGADERM 4X4.75 (GAUZE/BANDAGES/DRESSINGS) ×2 IMPLANT
ELECT REM PT RETURN 9FT ADLT (ELECTROSURGICAL) ×2
ELECTRODE REM PT RTRN 9FT ADLT (ELECTROSURGICAL) ×1 IMPLANT
ENDOLOOP SUT PDS II  0 18 (SUTURE) ×1
ENDOLOOP SUT PDS II 0 18 (SUTURE) IMPLANT
GAUZE SPONGE 2X2 8PLY STRL LF (GAUZE/BANDAGES/DRESSINGS) IMPLANT
GLOVE BIO SURGEON STRL SZ8 (GLOVE) ×1 IMPLANT
GLOVE BIOGEL PI IND STRL 7.5 (GLOVE) IMPLANT
GLOVE BIOGEL PI IND STRL 8 (GLOVE) ×1 IMPLANT
GLOVE BIOGEL PI INDICATOR 7.5 (GLOVE) ×4
GLOVE BIOGEL PI INDICATOR 8 (GLOVE) ×1
GLOVE ECLIPSE 8.0 STRL XLNG CF (GLOVE) ×2 IMPLANT
GOWN PREVENTION PLUS XLARGE (GOWN DISPOSABLE) ×2 IMPLANT
GOWN STRL NON-REIN LRG LVL3 (GOWN DISPOSABLE) ×5 IMPLANT
KIT BASIN OR (CUSTOM PROCEDURE TRAY) ×2 IMPLANT
KIT ROOM TURNOVER OR (KITS) ×2 IMPLANT
NEEDLE 22X1 1/2 (OR ONLY) (NEEDLE) ×2 IMPLANT
NS IRRIG 1000ML POUR BTL (IV SOLUTION) ×2 IMPLANT
PAD ARMBOARD 7.5X6 YLW CONV (MISCELLANEOUS) ×4 IMPLANT
POUCH SPECIMEN RETRIEVAL 10MM (ENDOMECHANICALS) ×1 IMPLANT
SCISSORS LAP 5X35 DISP (ENDOMECHANICALS) ×2 IMPLANT
SET CHOLANGIOGRAPH 5 50 .035 (SET/KITS/TRAYS/PACK) ×2 IMPLANT
SET IRRIG TUBING LAPAROSCOPIC (IRRIGATION / IRRIGATOR) ×2 IMPLANT
SLEEVE Z-THREAD 5X100MM (TROCAR) ×2 IMPLANT
SPECIMEN JAR SMALL (MISCELLANEOUS) ×2 IMPLANT
SPONGE GAUZE 2X2 STER 10/PKG (GAUZE/BANDAGES/DRESSINGS) ×3
SUT MNCRL AB 4-0 PS2 18 (SUTURE) ×2 IMPLANT
SUT VICRYL 0 TIES 12 18 (SUTURE) IMPLANT
TOWEL OR 17X24 6PK STRL BLUE (TOWEL DISPOSABLE) ×2 IMPLANT
TOWEL OR 17X26 10 PK STRL BLUE (TOWEL DISPOSABLE) ×2 IMPLANT
TRAY LAPAROSCOPIC (CUSTOM PROCEDURE TRAY) ×2 IMPLANT
TROCAR FALLER TUNNELING (TROCAR) ×2 IMPLANT
TROCAR XCEL NON-BLD 5MMX100MML (ENDOMECHANICALS) ×2 IMPLANT
TROCAR Z-THREAD FIOS 11X100 BL (TROCAR) ×2 IMPLANT
TROCAR Z-THREAD FIOS 5X100MM (TROCAR) ×2 IMPLANT
WATER STERILE IRR 1000ML POUR (IV SOLUTION) IMPLANT

## 2012-08-25 NOTE — H&P (Signed)
Christine Roach is an 31 y.o. female.   Chief Complaint:   Persistent RUQ pain with chills. HPI: She went to the emergency department 2 days ago because of persistent right upper quadrant pain as well as nausea and vomiting. She has known gallstones. No evidence of acute cholecystitis was found at that time but she has remained in pain and did not sleep last night. She is having chills. She walked into our office this morning for evaluation. She had a miscarriage approximately 6 weeks ago.  Liver function tests and white blood cell count were normal 2 days ago. Ultrasound demonstrates cholelithiasis but no ultrasound evidence of acute cholecystitis.  [name] and does not speak English. [name].  No past medical history on file.  Past Surgical History  Procedure Date  . Cesarian   . Cesarean section     No family history on file. Social History:  reports that she has never smoked. She does not have any smokeless tobacco history on file. She reports that she does not drink alcohol or use illicit drugs.  Allergies: No Known Allergies   (Not in a hospital admission)  No results found for this or any previous visit (from the past 48 hour(s)). No results found.  Review of Systems  Constitutional: Positive for chills. Negative for fever.  Respiratory: Negative.   Gastrointestinal: Positive for nausea and abdominal pain. Negative for constipation.  Musculoskeletal: Positive for back pain.    Last menstrual period 03/17/2012. Physical Exam  Constitutional: She appears well-developed and well-nourished.       She appears uncomfortable.  HENT:  Head: Normocephalic and atraumatic.  Eyes: EOM are normal. No scleral icterus.  Cardiovascular: Normal rate and regular rhythm.   Respiratory: Effort normal and breath sounds normal.  GI: There is tenderness (RUQ). Guarding: RUQ-positive Murphy's sign.  Musculoskeletal:  Normal range of motion. She exhibits no edema.  Skin: Skin is warm and dry.     Assessment/Plan Cholelithiasis with clinical signs of acute cholecystitis.  Plan: Admit to the hospital. Start IV antibiotics. Laparoscopic possible open cholecystectomy.  I have explained the procedure, risks, and aftercare of cholecystectomy.  Risks include but are not limited to bleeding, infection, wound problems, anesthesia, diarrhea, bile leak, injury to common bile duct/liver/intestine.  [name].   Maidie Streight J 08/25/2012, 10:43 AM    

## 2012-08-25 NOTE — Interval H&P Note (Signed)
History and Physical Interval Note:  08/25/2012 2:08 PM  Christine Roach  has presented today for surgery, with the diagnosis of Acute Cholecystitis  The various methods of treatment have been discussed with the patient and family. After consideration of risks, benefits and other options for treatment, the patient has consented to  Procedure(s) (LRB) with comments: LAPAROSCOPIC CHOLECYSTECTOMY WITH INTRAOPERATIVE CHOLANGIOGRAM (N/A) as a surgical intervention .  The patient's history has been reviewed, patient examined, no change in status, stable for surgery.  I have reviewed the patient's chart and labs.  Questions were answered to the patient's satisfaction.     Andranik Jeune C.

## 2012-08-25 NOTE — Transfer of Care (Signed)
Immediate Anesthesia Transfer of Care Note  Patient: Christine Roach  Procedure(s) Performed: Procedure(s) (LRB) with comments: LAPAROSCOPIC CHOLECYSTECTOMY WITH INTRAOPERATIVE CHOLANGIOGRAM (N/A)  Patient Location: PACU  Anesthesia Type:General  Level of Consciousness: sedated  Airway & Oxygen Therapy: Patient Spontanous Breathing and Patient connected to nasal cannula oxygen  Post-op Assessment: Report given to PACU RN and Post -op Vital signs reviewed and stable  Post vital signs: stable  Complications: No apparent anesthesia complications

## 2012-08-25 NOTE — ED Provider Notes (Signed)
Medical screening examination/treatment/procedure(s) were performed by non-physician practitioner and as supervising physician I was immediately available for consultation/collaboration.   Tametria Aho E Sharunda Salmon, MD 08/25/12 0809 

## 2012-08-25 NOTE — Anesthesia Postprocedure Evaluation (Signed)
  Anesthesia Post-op Note  Patient: Christine Roach  Procedure(s) Performed: Procedure(s) (LRB) with comments: LAPAROSCOPIC CHOLECYSTECTOMY WITH INTRAOPERATIVE CHOLANGIOGRAM (N/A)  Patient Location: PACU  Anesthesia Type:General  Level of Consciousness: awake, alert , oriented and patient cooperative  Airway and Oxygen Therapy: Patient Spontanous Breathing  Post-op Pain: mild  Post-op Assessment: Post-op Vital signs reviewed, Patient's Cardiovascular Status Stable, Respiratory Function Stable, Patent Airway, No signs of Nausea or vomiting and Pain level controlled  Post-op Vital Signs: stable  Complications: No apparent anesthesia complications

## 2012-08-25 NOTE — Anesthesia Preprocedure Evaluation (Addendum)
Anesthesia Evaluation  Patient identified by MRN, date of birth, ID band Patient awake    Reviewed: Allergy & Precautions, H&P , NPO status , Patient's Chart, lab work & pertinent test results  Airway Mallampati: I TM Distance: >3 FB     Dental  (+) Teeth Intact and Dental Advidsory Given   Pulmonary          Cardiovascular Rhythm:regular Rate:Normal     Neuro/Psych    GI/Hepatic   Endo/Other    Renal/GU      Musculoskeletal   Abdominal   Peds  Hematology   Anesthesia Other Findings   Reproductive/Obstetrics                          Anesthesia Physical Anesthesia Plan  ASA: I  Anesthesia Plan: General   Post-op Pain Management:    Induction: Intravenous  Airway Management Planned: Oral ETT  Additional Equipment:   Intra-op Plan:   Post-operative Plan: Extubation in OR  Informed Consent: I have reviewed the patients History and Physical, chart, labs and discussed the procedure including the risks, benefits and alternatives for the proposed anesthesia with the patient or authorized representative who has indicated his/her understanding and acceptance.     Plan Discussed with: CRNA, Anesthesiologist and Surgeon  Anesthesia Plan Comments:         Anesthesia Quick Evaluation

## 2012-08-25 NOTE — H&P (View-Only) (Signed)
Christine Roach is an 31 y.o. female.   Chief Complaint:   Persistent RUQ pain with chills. HPI: She went to the emergency department 2 days ago because of persistent right upper quadrant pain as well as nausea and vomiting. She has known gallstones. No evidence of acute cholecystitis was found at that time but she has remained in pain and did not sleep last night. She is having chills. She walked into our office this morning for evaluation. She had a miscarriage approximately 6 weeks ago.  Liver function tests and white blood cell count were normal 2 days ago. Ultrasound demonstrates cholelithiasis but no ultrasound evidence of acute cholecystitis.  [name] and does not speak [place]. [name].  No past medical history on file.  Past Surgical History  Procedure Date  . Cesarian   . Cesarean section     No family history on file. Social History:  reports that she has never smoked. She does not have any smokeless tobacco history on file. She reports that she does not drink alcohol or use illicit drugs.  Allergies: No Known Allergies   (Not in a hospital admission)  No results found for this or any previous visit (from the past 48 hour(s)). No results found.  Review of Systems  Constitutional: Positive for chills. Negative for fever.  Respiratory: Negative.   Gastrointestinal: Positive for nausea and abdominal pain. Negative for constipation.  Musculoskeletal: Positive for back pain.    Last menstrual period 03/17/2012. Physical Exam  Constitutional: She appears well-developed and well-nourished.       She appears uncomfortable.  HENT:  Head: Normocephalic and atraumatic.  Eyes: EOM are normal. No scleral icterus.  Cardiovascular: Normal rate and regular rhythm.   Respiratory: Effort normal and breath sounds normal.  GI: There is tenderness (RUQ). Guarding: RUQ-positive Murphy's sign.  Musculoskeletal:  Normal range of motion. She exhibits no edema.  Skin: Skin is warm and dry.     Assessment/Plan Cholelithiasis with clinical signs of acute cholecystitis.  Plan: Admit to the hospital. Start IV antibiotics. Laparoscopic possible open cholecystectomy.  I have explained the procedure, risks, and aftercare of cholecystectomy.  Risks include but are not limited to bleeding, infection, wound problems, anesthesia, diarrhea, bile leak, injury to common bile duct/liver/intestine.  [name].   Christine Roach J 08/25/2012, 10:43 AM

## 2012-08-25 NOTE — Anesthesia Procedure Notes (Signed)
Procedure Name: Intubation Date/Time: 08/25/2012 2:41 PM Performed by: Rossie Muskrat L Pre-anesthesia Checklist: Patient identified, Timeout performed, Emergency Drugs available, Suction available and Patient being monitored Patient Re-evaluated:Patient Re-evaluated prior to inductionOxygen Delivery Method: Circle system utilized Preoxygenation: Pre-oxygenation with 100% oxygen Intubation Type: IV induction Ventilation: Mask ventilation without difficulty Laryngoscope Size: Miller and 3 Grade View: Grade I Tube size: 7.5 mm Number of attempts: 1 Airway Equipment and Method: Stylet Placement Confirmation: ETT inserted through vocal cords under direct vision,  breath sounds checked- equal and bilateral and positive ETCO2 Secured at: 20 cm Tube secured with: Tape Dental Injury: Teeth and Oropharynx as per pre-operative assessment

## 2012-08-25 NOTE — Op Note (Signed)
08/25/2012  4:05 PM  PATIENT:  Christine Roach  31 y.o. female  Patient Care Team: Provider Default, MD as PCP - General  PRE-OPERATIVE DIAGNOSIS:  Acute Cholecystitis  POST-OPERATIVE DIAGNOSIS:  Acute Cholecystitis +GS  PROCEDURE:  Procedure(s): LAPAROSCOPIC CHOLECYSTECTOMY WITH INTRAOPERATIVE CHOLANGIOGRAM  SURGEON:  Surgeon(s): Ardeth Sportsman, MD  ASSISTANT: Aris [place], PA-C   ANESTHESIA:   local and general  EBL:  Total I/O In: 1000 [I.V.:1000] Out: 625 [Urine:550; Blood:75]  Delay start of Pharmacological VTE agent (>24hrs) due to surgical blood loss or risk of bleeding:  no  DRAINS: none   SPECIMEN:  Source of Specimen:  Gallbladder  DISPOSITION OF SPECIMEN:  PATHOLOGY  COUNTS:  YES  PLAN OF CARE: Admit for overnight observation  PATIENT DISPOSITION:  PACU - hemodynamically stable.  INDICATION:   Pleasant Hispanic female with a four-month history of intermittent abdominal pain consistent with biliary colic.  Initial workup was suspicious.  Now with more constant pain and positive Murphy sign.  Ultrasound with gallstones.  Dr. Abbey Chatters evaluated the patient in the office and recommended surgery.  Discussed with the help of a Spanish interpreter.  The patient was admitted to her service at [facility].  I discussed again in the holding area with a Spanish interpreter at the bedside:  The anatomy & physiology of hepatobiliary & pancreatic function was discussed.  The pathophysiology of gallbladder dysfunction was discussed.  Natural history risks without surgery was discussed.   I feel the risks of no intervention will lead to serious problems that outweigh the operative risks; therefore, I recommended cholecystectomy to remove the pathology.  I explained laparoscopic techniques with possible need for an open approach.  Probable cholangiogram to evaluate the bilary tract was explained as well.    Risks such as bleeding, infection, abscess, leak,  injury to other organs, need for further treatment, heart attack, death, and other risks were discussed.  I noted a good likelihood this will help address the problem.  Possibility that this will not correct all abdominal symptoms was explained.  Goals of post-operative recovery were discussed as well.  We will work to minimize complications.  An educational handout further explaining the pathology and treatment options was given as well.  Questions were answered.  The patient expresses understanding & wishes to proceed with surgery.  OR FINDINGS: Distended gallbladder with thickening consistent with acute cholecystitis.  Normal cholangiogram with classic biliary anatomy.  No common bile duct stones.  DESCRIPTION:   The patient was identified & brought into the operating room. The patient was positioned supine with arms tucked. SCDs were active during the entire case. The patient underwent general anesthesia without any difficulty.  The abdomen was prepped and draped in a sterile fashion. A Surgical Timeout confirmed our plan.  We positioned the patient in reverse [place] & right side up.  I placed a 5mm laparoscopic port through the abdominal wall using optical entry technique in the right upper quadrant.  Entry was clean.  We induced carbon dioxide insufflation. Camera inspection revealed no injury. There were no adhesions to the anterior abdominal wall supraumbilically.  I proceeded to continue with laparoscopic technique. I placed a #5 port in supraumbilical region, another 5mm port in the right flank near the anterior axillary line, and a 10mm port in the left subxiphoid region obliquely within the falciform ligament.  I turned attention to the right upper quadrant.  Omentum covered gallbladder.  I carefully freed off.  The gallbladder was too distended to  elevate.  It could not be grasped.  I aspirated mildly purulent bile the gallbladder.  I allowed the gallbladder to be grasped.  The  gallbladder fundus was elevated cephalad. I used hook cautery to free the peritoneal coverings between the gallbladder and the liver on the posteriolateral and anteriomedial walls.   I used careful blunt and hook dissection to help get a good critical view of the cystic artery and cystic duct. I did further dissection to free the lower half of the  gallbladder off the liver bed to get a good critical view of the infundibulum and cystic duct. I mobilized the cystic artery; and, after getting a good 360 view, ligated the cystic artery using clips. I skeletonized the cystic duct.  I placed a clip on the infundibulum. I did a partial cystic duct-otomy and ensured patency. I placed a 5 [place] cholangiocatheter through a puncture site at the right subcostal ridge of the abdominal wall and directed it into the cystic duct.  We ran a cholangiogram with dilute radio-opaque contrast and continuous fluoroscopy. Contrast flowed from a side branch consistent with cystic duct cannulization. Contrast flowed up the common hepatic duct into the right and left intrahepatic chains out to secondary radicals. Contrast flowed down the common bile duct easily across the normal ampulla into the duodenum.  This was consistent with a normal cholangiogram.  I removed the cholangiocatheter. I placed clips on the cystic duct x4.  I completed cystic duct transection.   I freed the gallbladder from its remaining attachments to the liver. I ensured hemostasis on the gallbladder fossa of the liver and elsewhere.  Blood loss was above average since the planes between the gallbladder and liver were cemented.  Friable liver.  I inspected the rest of the abdomen & detected no injury nor bleeding elsewhere.  I removed the gallbladder inside an Endo Catch bag out the subxiphoid wound.  I closed the subxiphoid fascia transversely using 0 Vicryl interrupted stitches With the help of a laparoscopic suture passer under direct laparoscopic  visualization.  We closed the skin using 4-0 monocryl stitch.  Sterile dressings were applied. The patient was extubated & arrived in the PACU in stable condition..  I had discussed postoperative care with the patient in the holding area.  I am about to locate the patient's family and discuss operative findings and postoperative goals / instructions.  Instructions are written in the chart as well.

## 2012-08-25 NOTE — Patient Instructions (Signed)
Go to [facility] Admitting 

## 2012-08-25 NOTE — Progress Notes (Signed)
She comes to the office today with signs and symptoms consistent with acute cholecystitis. She is being admitted to the hospital. A full history and physical will be dictated.

## 2012-08-25 NOTE — Preoperative (Signed)
Beta Blockers   Reason not to administer Beta Blockers:Not Applicable 

## 2012-08-26 ENCOUNTER — Encounter (HOSPITAL_COMMUNITY): Payer: Self-pay | Admitting: Surgery

## 2012-08-26 MED ORDER — NAPROXEN 500 MG PO TABS
500.0000 mg | ORAL_TABLET | Freq: Two times a day (BID) | ORAL | Status: DC
  Administered 2012-08-26: 500 mg via ORAL
  Filled 2012-08-26 (×4): qty 1

## 2012-08-26 MED ORDER — PROMETHAZINE HCL 25 MG/ML IJ SOLN: 12.5000 mg | Freq: Four times a day (QID) | INTRAMUSCULAR | Status: DC | PRN

## 2012-08-26 MED ORDER — SODIUM CHLORIDE 0.9 % IJ SOLN: 3.0000 mL | INTRAMUSCULAR | Status: DC | PRN

## 2012-08-26 MED ORDER — MAGIC MOUTHWASH
15.0000 mL | Freq: Four times a day (QID) | ORAL | Status: DC | PRN
  Filled 2012-08-26: qty 15

## 2012-08-26 MED ORDER — LIP MEDEX EX OINT
1.0000 "application " | TOPICAL_OINTMENT | Freq: Two times a day (BID) | CUTANEOUS | Status: DC
  Filled 2012-08-26: qty 7

## 2012-08-26 MED ORDER — LACTATED RINGERS IV BOLUS (SEPSIS): 1000.0000 mL | Freq: Three times a day (TID) | INTRAVENOUS | Status: DC | PRN

## 2012-08-26 MED ORDER — BISACODYL 10 MG RE SUPP: 10.0000 mg | Freq: Two times a day (BID) | RECTAL | Status: DC | PRN

## 2012-08-26 MED ORDER — MAGNESIUM HYDROXIDE 400 MG/5ML PO SUSP: 30.0000 mL | Freq: Two times a day (BID) | ORAL | Status: DC | PRN

## 2012-08-26 MED ORDER — SODIUM CHLORIDE 0.9 % IJ SOLN
3.0000 mL | Freq: Two times a day (BID) | INTRAMUSCULAR | Status: DC
  Administered 2012-08-26: 3 mL via INTRAVENOUS

## 2012-08-26 MED ORDER — MORPHINE SULFATE 2 MG/ML IJ SOLN
1.0000 mg | INTRAMUSCULAR | Status: DC | PRN
  Administered 2012-08-26: 2 mg via INTRAVENOUS
  Filled 2012-08-26: qty 2

## 2012-08-26 MED ORDER — BLISTEX EX OINT
TOPICAL_OINTMENT | Freq: Two times a day (BID) | CUTANEOUS | Status: DC
  Administered 2012-08-26: 22:00:00 via TOPICAL
  Filled 2012-08-26: qty 10

## 2012-08-26 MED ORDER — ALUM & MAG HYDROXIDE-SIMETH 200-200-20 MG/5ML PO SUSP: 30.0000 mL | Freq: Four times a day (QID) | ORAL | Status: DC | PRN

## 2012-08-26 NOTE — Progress Notes (Signed)
1 Day Post-Op  Subjective: Feeling ok, moderated pain, around incision area. Ambulating only to bathroom. Denies flatus. No BM. Normal UOP. On clears and tolerating them well.   Objective: Vital signs in last 24 hours: Temp:  [97.7 F (36.5 C)-99.4 F (37.4 C)] 97.7 F (36.5 C) (11/05 0700) Pulse Rate:  [79-97] 79  (11/05 0700) Resp:  [10-18] 18  (11/05 0700) BP: (101-127)/(66-76) 109/69 mmHg (11/05 0700) SpO2:  [96 %-100 %] 99 % (11/05 0700) Weight:  [164 lb 10.9 oz (74.7 kg)] 164 lb 10.9 oz (74.7 kg) (11/04 1249) Last BM Date: 08/24/12  Intake/Output from previous day: 11/04 0701 - 11/05 0700 In: 1945.8 [I.V.:1945.8] Out: 1425 [Urine:1350; Blood:75] Intake/Output this shift:    PE: Abd: Soft, tender around incision areas, which are clean, no erythema or drainage. Normal BS.   Lab Results:   Basename 08/25/12 1243  WBC 9.5  HGB 14.3  HCT 42.2  PLT 295   BMET  Basename 08/25/12 1243  NA 137  K 4.4  CL 101  CO2 27  GLUCOSE 91  BUN 6  CREATININE 0.58  CALCIUM 9.2   CMP     Component Value Date/Time   NA 137 08/25/2012 1243   K 4.4 08/25/2012 1243   CL 101 08/25/2012 1243   CO2 27 08/25/2012 1243   GLUCOSE 91 08/25/2012 1243   BUN 6 08/25/2012 1243   CREATININE 0.58 08/25/2012 1243   CALCIUM 9.2 08/25/2012 1243   PROT 7.6 08/25/2012 1243   ALBUMIN 4.0 08/25/2012 1243   AST 15 08/25/2012 1243   ALT 11 08/25/2012 1243   ALKPHOS 71 08/25/2012 1243   BILITOT 0.6 08/25/2012 1243   GFRNONAA >90 08/25/2012 1243   GFRAA >90 08/25/2012 1243   Lipase     Component Value Date/Time   LIPASE 28 08/23/2012 0645   Studies/Results: Dg Cholangiogram Operative  08/25/2012  *RADIOLOGY REPORT*  Clinical Data:   Cholecystectomy for cholelithiasis and cholecystitis.  INTRAOPERATIVE CHOLANGIOGRAM  Technique:  Cholangiographic images from the C-arm fluoroscopic device were submitted for interpretation post-operatively.  Please see the procedural report for the amount of contrast and the  fluoroscopy time utilized.  Comparison:  Ultrasound dated 08/23/2012  Findings:  Intraoperative cholangiogram shows normal caliber bile ducts and prompt drainage of contrast into the duodenum.  No filling defects or extravasation identified.  IMPRESSION: Normal intraoperative cholangiogram.   Original Report Authenticated By: Irish Lack, M.D.    Anti-infectives: Anti-infectives     Start     Dose/Rate Route Frequency Ordered Stop   08/25/12 1800   Ampicillin-Sulbactam (UNASYN) 3 g in sodium chloride 0.9 % 100 mL IVPB        3 g 100 mL/hr over 60 Minutes Intravenous Every 6 hours 08/25/12 1748 08/30/12 1759   08/25/12 1415   Ampicillin-Sulbactam (UNASYN) 3 g in sodium chloride 0.9 % 100 mL IVPB        3 g 100 mL/hr over 60 Minutes Intravenous To Surgery 08/25/12 1413 08/26/12 1415   08/25/12 1158   Ampicillin-Sulbactam (UNASYN) 3 g in sodium chloride 0.9 % 100 mL IVPB  Status:  Discontinued        3 g 100 mL/hr over 60 Minutes Intravenous Every 6 hours 08/25/12 1158 08/26/12 0252          Assessment/Plan 31 y/o s/p LAPAROSCOPIC CHOLECYSTECTOMY WITH INTRAOPERATIVE CHOLANGIOGRAM - continue antibiotic IV and current pain management. - advance diet as tolerated - ready for discharge when tolerating oral intake well, ambulating in  walkways, adequate pain control without IV medications, uinating and passing flatus. - DVT prophylaxis: SCD's   LOS: 1 day   PILOTO, Kaylum Shrum 08/26/2012, 9:55 AM

## 2012-08-26 NOTE — Progress Notes (Signed)
Work to control pain ABx x5 days.  Continue IV & change to PO as outpt Mobilize more Able to update husband w interpreter over phone last night.  Our team d/w them this AM

## 2012-08-27 MED ORDER — HYDROCODONE-ACETAMINOPHEN 5-325 MG PO TABS: 1.0000 | ORAL_TABLET | ORAL | Status: DC | PRN

## 2012-08-27 MED ORDER — BISACODYL 10 MG RE SUPP: 10.0000 mg | Freq: Once | RECTAL | Status: DC

## 2012-08-27 MED ORDER — AMOXICILLIN-POT CLAVULANATE 875-125 MG PO TABS: 1.0000 | ORAL_TABLET | Freq: Two times a day (BID) | ORAL | Status: DC

## 2012-08-27 MED ORDER — DOCUSATE SODIUM 100 MG PO CAPS: 100.0000 mg | ORAL_CAPSULE | Freq: Two times a day (BID) | ORAL | Status: DC

## 2012-08-27 NOTE — Discharge Summary (Signed)
  Physician Discharge Summary  Patient ID: Christine Roach MRN: [id number] DOB/AGE: 07/02/1981 31 y.o.  Admit date: 08/25/2012 Discharge date: 08/27/2012  Admitting Diagnosis: Persistent RUQ pain Acute Cholecystitis   Discharge Diagnosis Patient Active Problem List   Diagnosis Date Noted  . Acute cholecystitis 08/25/2012   Consultants none  Imaging: Dg Cholangiogram Operative  08/25/2012  *RADIOLOGY REPORT*  Clinical Data:   Cholecystectomy for cholelithiasis and cholecystitis.  INTRAOPERATIVE CHOLANGIOGRAM  Technique:  Cholangiographic images from the C-arm fluoroscopic device were submitted for interpretation post-operatively.  Please see the procedural report for the amount of contrast and the fluoroscopy time utilized.  Comparison:  Ultrasound dated 08/23/2012  Findings:  Intraoperative cholangiogram shows normal caliber bile ducts and prompt drainage of contrast into the duodenum.  No filling defects or extravasation identified.  IMPRESSION: Normal intraoperative cholangiogram.   Original Report Authenticated By: Irish Lack, M.D.    Procedures LAPAROSCOPIC CHOLECYSTECTOMY WITH INTRAOPERATIVE CHOLANGIOGRAM  SURGEON:  Ardeth Sportsman, MD   Hospital Course:  31 y/o F who presented to [facility] with RUQ pain and chills.  Workup showed Acute Cholecystitis .  Patient was admitted and underwent procedure listed above.  Tolerated procedure well and was transferred to the floor.  Diet was advanced as tolerated.  On POD 1, the patient was voiding well, tolerating diet, ambulating well, pain well controlled, vital signs stable, incisions c/d/i and felt stable for discharge home.  Patient will follow up in our office in 3 weeks and knows to call with questions or concerns.  Physical Exam: General:  Alert, NAD, pleasant, comfortable Abd:  Soft, ND, mild tenderness, incisions C/D/I.    Medication List     As of 08/27/2012  8:23 AM    STOP taking these medications         OVER THE  COUNTER MEDICATION      oxyCODONE-acetaminophen 5-325 MG per tablet   Commonly known as: PERCOCET/ROXICET      promethazine 25 MG tablet   Commonly known as: PHENERGAN      TAKE these medications         amoxicillin-clavulanate 875-125 MG per tablet   Commonly known as: AUGMENTIN   Take 1 tablet by mouth 2 (two) times daily.      HYDROcodone-acetaminophen 5-325 MG per tablet   Commonly known as: NORCO/VICODIN   Take 1-2 tablets by mouth every 4 (four) hours as needed.             Follow-up Information    Follow up with [facility]. Schedule an appointment as soon as possible for a visit in 3 weeks.   Contact information:   [address]   [place] [address] [phone]          Signed: [phone] [place] Rolene Arbour, MD [facility] Resident [facility] [phone]  08/27/2012, 8:23 AM

## 2012-08-27 NOTE — Progress Notes (Signed)
Patient discharged to home in care of spouse. Medications and instructions reviewed with patient and spouse and all questions answered. IV d/c'd with cath intact. Dressing CDI. Assessment unchanged from this am. Patient is to follow up with [facility] in 3 weeks.

## 2012-08-27 NOTE — Discharge Summary (Signed)
Patient doing well after cholecystectomy.  Appropriately tender.  Inc dressing: c/d/i  Pt requesting suppository prior to d/c  Remove dressings in AM  Ok to d/c home later today

## 2012-09-16 ENCOUNTER — Ambulatory Visit (INDEPENDENT_AMBULATORY_CARE_PROVIDER_SITE_OTHER): Payer: Self-pay | Admitting: General Surgery

## 2012-09-16 ENCOUNTER — Encounter (INDEPENDENT_AMBULATORY_CARE_PROVIDER_SITE_OTHER): Payer: Self-pay | Admitting: General Surgery

## 2012-09-16 VITALS — BP 104/68 | HR 71 | Temp 97.4°F | Resp 16 | Ht 66.0 in | Wt 167.6 lb

## 2012-09-16 DIAGNOSIS — Z9889 Other specified postprocedural states: Secondary | ICD-10-CM

## 2012-09-16 DIAGNOSIS — K8000 Calculus of gallbladder with acute cholecystitis without obstruction: Secondary | ICD-10-CM

## 2012-09-16 NOTE — Patient Instructions (Signed)
Follow up as needed

## 2012-09-16 NOTE — Progress Notes (Signed)
Toi OCTAVO-CARRILLO 1981/09/19 [id number] 09/16/2012   Christine Roach is a 31 y.o. female who had a laparoscopic cholecystectomy with intraoperative cholangiogram by Dr. Michaell Cowing.  The pathology report confirmed acute on chronic cholecystitis with necorsis.  The patient reports that they are feeling well with normal bowel movements and good appetite.  The pre-operative symptoms of abdominal pain, nausea, and vomiting have resolved.  She is still having some residual soreness mostly with movement.  Physical examination - Incisions appear well-healed with no sign of infection or bleeding.   Abdomen - soft, non-tender  Impression:  s/p laparoscopic cholecystectomy  Plan:  She may resume a regular diet and full activity.  She may follow-up on a PRN basis.

## 2013-04-06 ENCOUNTER — Ambulatory Visit: Payer: Medicaid Other | Admitting: Obstetrics & Gynecology

## 2013-04-27 ENCOUNTER — Ambulatory Visit (INDEPENDENT_AMBULATORY_CARE_PROVIDER_SITE_OTHER): Payer: Self-pay | Admitting: Family Medicine

## 2013-04-27 ENCOUNTER — Encounter: Payer: Self-pay | Admitting: Family Medicine

## 2013-04-27 VITALS — BP 115/75 | HR 87 | Temp 97.6°F | Ht 65.0 in | Wt 175.2 lb

## 2013-04-27 DIAGNOSIS — Z01419 Encounter for gynecological examination (general) (routine) without abnormal findings: Secondary | ICD-10-CM

## 2013-04-27 NOTE — Patient Instructions (Signed)
Prueba de Papanicolaou  (Pap Test)  La prueba de Papanicolaou es un procedimiento realizado en una clnica para evaluar las clulas que estn en la superficie del cuello uterino. El cuello uterino se encuentra entre la parte inferior del tero y la parte superior de la vagina. En algunas mujeres, la regin del cuello del tero tiene el potencial de formar clulas cancerosas. Con evaluaciones constantes por su mdico, este tipo de cncer se puede prevenir.  Si una prueba de Papanicolaou es anormal, es generalmente el resultado de una exposicin previa al virus del papiloma humano (VPH). El VPH es un virus que puede infectar las clulas del cuello uterino y la displasia causa. La displasia es cuando las clulas no se ven normales. Si una mujer ha sido diagnosticada con displasia de alto grado o severa, est en mayor riesgo de desarrollar cncer cervical. Las personas diagnosticadas con displasia de bajo grado deben controlarse con su mdico porque hay una pequea probabilidad de que displasias de bajo grado puedan convertirse en cncer.  INFORME A SU MDICO SOBRE:   Si ha tenido una reciente infeccin de transmisin sexual (ITS).  Las nuevas parejas sexuales que ha tenido.  La historia de resultados de las anteriores pruebas de Papanicolaou anormales.  La historia de los procedimientos cervicales anteriores que haya tenido (colposcopia, biopsia, procedimiento de excisin electroquirrgica [LEEP]).  Las preocupaciones que haya tenido respecto a secreciones vaginales inusuales.  Antecedentes de dolor plvico.  El uso de mtodos anticonceptivos. ANTES DEL PROCEDIMIENTO   Pregntele a su mdico cundo programar la prueba. Es mejor hacerla cuando no est en su perodo si el mdico usa una esptula de madera para recoger clulas o coloca las clulas en un portaobjetos de vidrio. Las tcnicas no son tan sensibles durante el ciclo menstrual.  No use duchas vaginales ni tenga relaciones sexuales durante  las 24 horas anteriores al procedimiento.   No use cremas vaginales o tampones durante las 24 horas antes de la prueba.   Vace su vejiga justo antes del procedimiento para disminuir las molestias.  PROCEDIMIENTO  Deber acostarse en una camilla con los pies en los estribos. Se colocar un instrumento tibio de metal o plstico (espculo) en la vagina. Este instrumento permite al mdico ver el interior de la vagina y observar el cuello del tero. Luego se usa un cepillo de plstico pequeo, o una esptula de madera para recoger las clulas. Esas clulas se colocan en un recipiente para ser examinadas en el laboratorio. Las clulas se estudian en el microscopio. Un especialista determinar si las clulas son normales.  DESPUS DEL PROCEDIMIENTO  Asegrese de obtener los resultados.Si los resultados son anormales, puede ser necesario que se haga nuevos estudios.  Document Released: 03/26/2008 Document Revised: 12/31/2011 Exit[employer] Patient Information 2014 [employer].  

## 2013-04-27 NOTE — Progress Notes (Signed)
Patient ID: Christine Roach, female   DOB: [birth year], 32 y.o.   MRN: [id number]   S:  Here for annual exam. One month ago had burning pain in chest on right above breast, no lumps, swelling or SOB. Last pap 2 years ago after last baby, with Dr.  Gaynell Face (negative per pt). Documented pap 2010 and 2009, negative. No history abnl paps. Had endocervical polyp in 2009, no path but presumably benign.  No pelvic pain. No vaginal discharge. Bowel and bladder function normal.  Married, one partner, not concerned about STDs.  LMP:  04/03/13, normal. Irregular, sometimes 2-3 months between periods. Last 3-5 days, min to mod flow. Contraception:  Condoms.  Social:  Non-smoker.  O:   Filed Vitals:   04/27/13 1359  BP: 115/75  Pulse: 87  Temp: 97.6 F (36.4 C)    Physical exam: GEN:  WNWD, no distress HEENT:  NCAT, EOMI, conjunctiva clear Breasts:  No masses, skin normal, no dimpling. Nipples: no retraction or discharge, no axillary lymphadenopathy CV: RRR, no murmur RESP:  CTAB ABD:  Soft, non-tender, no guarding or rebound, normal bowel sounds EXTREM:  Warm, well perfused, no edema or tenderness NEURO:  Alert, oriented, no focal deficits GU:  Normal external genitalia, normal vagina, minimal discharge, normal cervix, no CMT. Normal uterus - mobile, no masses, normal size and contour.  No adnexal masses or tenderness.   A/P: 32 y.o. G1P0010 here for annual well-woman exam - Pap done today - No mammogram or colonoscopy indicated. CMP and CBC in November 2013 normal - Return in 1 year for annual exam

## 2013-04-27 NOTE — Progress Notes (Signed)
C/o pain since January sometimes in legs/ joints/hands - thinks is bone or joint.

## 2013-04-28 ENCOUNTER — Encounter: Payer: Self-pay | Admitting: Family Medicine

## 2014-08-23 ENCOUNTER — Encounter: Payer: Self-pay | Admitting: Family Medicine

## 2015-01-21 ENCOUNTER — Encounter: Payer: Self-pay | Admitting: Obstetrics & Gynecology

## 2015-01-21 ENCOUNTER — Ambulatory Visit (INDEPENDENT_AMBULATORY_CARE_PROVIDER_SITE_OTHER): Payer: Self-pay | Admitting: Obstetrics & Gynecology

## 2015-01-21 ENCOUNTER — Other Ambulatory Visit: Payer: Self-pay | Admitting: Obstetrics & Gynecology

## 2015-01-21 VITALS — BP 118/75 | HR 72 | Wt 191.4 lb

## 2015-01-21 DIAGNOSIS — Z202 Contact with and (suspected) exposure to infections with a predominantly sexual mode of transmission: Secondary | ICD-10-CM

## 2015-01-21 DIAGNOSIS — N926 Irregular menstruation, unspecified: Secondary | ICD-10-CM

## 2015-01-21 DIAGNOSIS — N898 Other specified noninflammatory disorders of vagina: Secondary | ICD-10-CM

## 2015-01-21 MED ORDER — NORGESTIM-ETH ESTRAD TRIPHASIC 0.18/0.215/0.25 MG-35 MCG PO TABS: 1.0000 | ORAL_TABLET | Freq: Every day | ORAL | Status: DC

## 2015-01-21 NOTE — Progress Notes (Signed)
Patient ID: Christine Roach, female   DOB: 11/23/1980, 34 y.o.   MRN: [id number]  Chief Complaint  Patient presents with  . Menorrhagia    HPI Christine Roach is a 34 y.o. female.  [id number] No LMP recorded. Has had irregular menses in the past and had prolonged DUB last month for 3 weeks  HPI  History reviewed. No pertinent past medical history.  Past Surgical History  Procedure Laterality Date  . Cesarean section    . Cholecystectomy  08/25/2012    Procedure: LAPAROSCOPIC CHOLECYSTECTOMY WITH INTRAOPERATIVE CHOLANGIOGRAM;  Surgeon: Ardeth SportsmanSteven C. Gross, MD;  Location: [facility] OR;  Service: General;  Laterality: N/A;    Family History  Problem Relation Age of Onset  . Hypertension Mother     Social History History  Substance Use Topics  . Smoking status: Never Smoker   . Smokeless tobacco: Never Used  . Alcohol Use: No    No Known Allergies  No current outpatient prescriptions on file.   No current facility-administered medications for this visit.    Review of Systems Review of Systems  Constitutional: Negative.   Genitourinary: Positive for vaginal discharge and menstrual problem. Negative for vaginal bleeding and pelvic pain.    Blood pressure 118/75, pulse 72, weight 191 lb 6.4 oz (86.818 kg).  Physical Exam Physical Exam  Constitutional: She is oriented to person, place, and time. She appears well-developed. No distress.  Pulmonary/Chest: Effort normal. No respiratory distress.  Abdominal: Soft.  Genitourinary: Vagina normal and uterus normal.  Wet prep  Neurological: She is alert and oriented to person, place, and time.  Skin: Skin is warm and dry.  Psychiatric: She has a normal mood and affect. Her behavior is normal.  Vitals reviewed.   Data Reviewed Pap 2014 nl  Assessment    DUB, suspect PCOS     Plan    Tricyclen, RTC 4 mo        Jeovanni Heuring 01/21/2015, 11:12 AM

## 2015-01-21 NOTE — Patient Instructions (Signed)
Uso de los anticonceptivos orales (Oral Contraception Use) Los anticonceptivos orales (ACO) son medicamentos que se utilizan para evitar el embarazo. Su funcin es evitar que los ovarios liberen vulos. Las hormonas de los ACO tambin hacen que el moco cervical se haga ms espeso, lo que evita que el esperma ingrese al tero. Tambin hacen que la membrana que recubre internamente al tero se vuelva ms fina, lo que no permite que el huevo fertilizado se adhiera a la pared del tero. Los ACO son muy efectivos cuando se toman exactamente como se prescriben. Sin embargo, no previenen contra las enfermedades de transmisin sexual (ETS). La prctica del sexo seguro, como el uso de preservativos, junto con los ACO, ayudan a prevenir ese tipo de enfermedades. Antes de tomar ACO, debe hacerse un examen fsico y un Papanicolau. El mdico podr indicarle anlisis de sangre, si es necesario. El mdico se asegurar de que usted sea una buena candidata para usar anticonceptivos orales. Converse con su mdico acerca de los posibles efectos secundarios de los ACO que podran recetarle. Cuando se inicia el uso de ACO, se pueden tomar durante 2 a 3 meses para que el cuerpo se adapte a los cambios en los niveles hormonales en el cuerpo.  CMO TOMAR LOS ANTICONCEPTIVOS ORALES El mdico le indicar como comenzar a tomar el primer ciclo de ACO. De lo contrario usted puede:   Comenzar el da de inicio del ciclo menstrual. No necesitar proteccin anticonceptiva adicional al comenzar en este momento.   Comenzar el primer domingo luego de su perodo menstrual, o el da en que adquiere el medicamento. En estos casos deber tener proteccin anticonceptiva adicional durante los primeros 7 das del ciclo.   Comenzar a tomarlos en cualquier momento del ciclo. Si toma el anticonceptivo dentro de los 5 das de iniciado el perodo, estar protegida de quedar embarazada inmediatamente. En este caso, no necesitar una forma adicional de  anticonceptivos. Si comienza en cualquier otro momento del ciclo menstrual, necesitar usar otra forma de anticonceptivo durante 7 das. Si sus ACO son del tipo de los llamados minipldoras, podrn impedir el embarazo despus de tomarlas por 2 das (48 horas). Luego de comenzar a tomar los ACO:   Si olvid de tomar 1 pldora, tmela tan pronto como lo recuerde. Tome la siguiente pldora a la hora habitual.   Si dej de tomar 2 o ms pldoras, comunquese con su mdico ya que diferentes pldoras tienen diferentes instrucciones para las dosis que no se han tomado. Si olvida tomar 2 o ms pldoras, utilice un mtodo anticonceptivo adicional hasta que comience su prximo perodo menstrual.   Si utiliza el envase de 28 pldoras que contienen pldoras inactivas y olvida tomar 1 de las ltimas 7 (pldoras sin hormonas), sto no tiene importancia. Simplemente deseche el resto de las pldoras que no contienen hormonas y comience un nuevo envase.  No importa cuando comience a tomar los anticonceptivos, siempre empiece un nuevo envase el mismo da de la semana. Tenga un envase extra de ACO y use un mtodo anticonceptivo adicional para el caso en que se olvide de tomar algunas pldoras o pierda la caja.  INSTRUCCIONES PARA EL CUIDADO EN EL HOGAR   No fume.   Use siempre un condn para protegerse contra las enfermedades de transmisin sexual. Los ACO no protegen contra las enfermedades de transmisin sexual.   Use un almanaque para marcar los das de su perodo menstrual.   Lea la informacin y consejos que vienen con las ACO. Hable con   el profesional si tiene dudas.  SOLICITE ATENCIN MDICA SI:   Presenta nuseas o vmitos.   Tiene flujo o sangrado vaginal anormal.   Aparece una erupcin cutnea.   No tiene el perodo menstrual.   Pierde el cabello.   Necesita tratamiento por cambios en su estado de nimo o por depresin.   Se siente mareada al tomar los ACO.   Comienza a  aparecer acn con el uso de los ACO.   Queda embarazada.  SOLICITE ATENCIN MDICA DE INMEDIATO SI:   Siente dolor en el pecho.   Le falta el aire.   Le duele mucho la cabeza y no puede controlar el dolor.   Siente adormecimiento o tiene dificultad para hablar.   Tiene problemas de visin.   Presenta dolor, inflamacin o hinchazn en las piernas.  Document Released: 09/27/2011 Document Revised: 06/10/2013 [employer] Patient Information 2015 [employer]. This information is not intended to replace advice given to you by your health care provider. Make sure you discuss any questions you have with your health care provider.  

## 2015-01-22 LAB — WET PREP, GENITAL
Clue Cells Wet Prep HPF POC: NONE SEEN
Trich, Wet Prep: NONE SEEN
WBC WET PREP: NONE SEEN
YEAST WET PREP: NONE SEEN

## 2015-01-22 LAB — GC/CHLAMYDIA PROBE AMP
CT Probe RNA: NEGATIVE
GC Probe RNA: NEGATIVE

## 2016-10-04 ENCOUNTER — Ambulatory Visit (INDEPENDENT_AMBULATORY_CARE_PROVIDER_SITE_OTHER): Payer: Self-pay | Admitting: Physician Assistant

## 2016-10-04 VITALS — BP 104/68 | HR 91 | Temp 98.2°F | Resp 18 | Ht 65.0 in | Wt 189.0 lb

## 2016-10-04 DIAGNOSIS — Z13220 Encounter for screening for lipoid disorders: Secondary | ICD-10-CM

## 2016-10-04 DIAGNOSIS — Z13228 Encounter for screening for other metabolic disorders: Secondary | ICD-10-CM

## 2016-10-04 DIAGNOSIS — Z130 Encounter for screening for diseases of the blood and blood-forming organs and certain disorders involving the immune mechanism: Secondary | ICD-10-CM

## 2016-10-04 DIAGNOSIS — Z833 Family history of diabetes mellitus: Secondary | ICD-10-CM

## 2016-10-04 DIAGNOSIS — N644 Mastodynia: Secondary | ICD-10-CM

## 2016-10-04 DIAGNOSIS — Z9049 Acquired absence of other specified parts of digestive tract: Secondary | ICD-10-CM | POA: Insufficient documentation

## 2016-10-04 DIAGNOSIS — Z01419 Encounter for gynecological examination (general) (routine) without abnormal findings: Secondary | ICD-10-CM

## 2016-10-04 DIAGNOSIS — Z23 Encounter for immunization: Secondary | ICD-10-CM

## 2016-10-04 DIAGNOSIS — Z0000 Encounter for general adult medical examination without abnormal findings: Secondary | ICD-10-CM

## 2016-10-04 DIAGNOSIS — N926 Irregular menstruation, unspecified: Secondary | ICD-10-CM

## 2016-10-04 LAB — POCT URINE PREGNANCY: Preg Test, Ur: NEGATIVE

## 2016-10-04 NOTE — Progress Notes (Signed)
Christine Roach  MRN: [id number] DOB: 09/04/1981  Subjective:  Pt presents to clinic for a CPE.  Right breast - 4 days ago she developed breast pain that only hurts when she touches the breast or she moves her arm and it touches her breast - no nipple discharge - no F/C - no trauma to the breast - the pain has not changes since it started - no family h/o breast cancer  LMP - 3 months ago - she has irregular menses - sometimes condoms but not always  Last dental exam:  4 months ago Last vision exam: 2 years ago - she has glasses for driving Last pap smear:  2.5 years - normal Last mammogram:never  Vaccinations      Tetanus - unsure  Exercise: no Diet:  Patient Active Problem List   Diagnosis Date Noted  . S/P cholecystectomy 10/04/2016  . Irregular menses 01/21/2015    No current outpatient prescriptions on file prior to visit.   No current facility-administered medications on file prior to visit.     No Known Allergies  Social History   Social History  . Marital status: Married    Spouse name: Freida Busman  . Number of children: 3  . Years of education: N/A   Social History Main Topics  . Smoking status: Never Smoker  . Smokeless tobacco: Never Used  . Alcohol use No  . Drug use: No  . Sexual activity: Yes    Partners: Male    Birth control/ protection: Condom   Other Topics Concern  . None   Social History Narrative   Marital status: married - Freida Busman - moved to [place] from [place] 2006   Children: 3 boys   Lives with: husband and children   Employment: stays at home   Tobacco:  no   Alcohol:  no   Drugs:  no   Exercise:  no   Seatbelt: 100%   Guns in home: no       Past Surgical History:  Procedure Laterality Date  . CESAREAN SECTION    . CHOLECYSTECTOMY  08/25/2012   Procedure: LAPAROSCOPIC CHOLECYSTECTOMY WITH INTRAOPERATIVE CHOLANGIOGRAM;  Surgeon: Adin Hector, MD;  Location: [facility];  Service: General;  Laterality: N/A;    Family History    Problem Relation Age of Onset  . Hypertension Mother   . Diabetes Mother   . Cancer Paternal Grandmother     liver    Review of Systems  Constitutional: Negative.   HENT: Negative.   Eyes: Negative.   Respiratory: Negative.   Cardiovascular: Negative.   Gastrointestinal: Negative.   Endocrine: Negative.   Genitourinary: Negative.   Musculoskeletal: Negative.   Skin: Negative.   Allergic/Immunologic: Negative.   Neurological: Negative.   Hematological: Negative.   Psychiatric/Behavioral: Negative.     Objective:  BP 104/68 (BP Location: Right Arm, Patient Position: Sitting, Cuff Size: Small)   Pulse 91   Temp 98.2 F (36.8 C) (Oral)   Resp 18   Ht _0  (1.651 m)   Wt 189 lb (85.7 kg)   SpO2 98%   BMI 31.45 kg/m   Physical Exam  Constitutional: She is oriented to person, place, and time and well-developed, well-nourished, and in no distress.  HENT:  Head: Normocephalic and atraumatic.  Right Ear: Hearing, tympanic membrane, external ear and ear canal normal.  Left Ear: Hearing, tympanic membrane, external ear and ear canal normal.  Nose: Nose normal.  Mouth/Throat: Uvula is midline, oropharynx is clear and  moist and mucous membranes are normal.  Eyes: Conjunctivae and EOM are normal. Pupils are equal, round, and reactive to light.  Neck: Trachea normal and normal range of motion. Neck supple. No thyroid mass and no thyromegaly present.  Cardiovascular: Normal rate, regular rhythm and normal heart sounds.   No murmur heard. Pulmonary/Chest: Effort normal and breath sounds normal. She has no wheezes. Right breast exhibits tenderness. Right breast exhibits no inverted nipple, no mass, no nipple discharge and no skin change. Left breast exhibits no inverted nipple, no mass, no nipple discharge, no skin change and no tenderness. Breasts are symmetrical.  Abdominal: Soft. Bowel sounds are normal. There is no tenderness.  Genitourinary: Vagina normal, uterus normal, cervix  normal, right adnexa normal, left adnexa normal and vulva normal.  Genitourinary Comments: Friable cervix with mucus from the os  Musculoskeletal: Normal range of motion.  Lymphadenopathy:    She has no cervical adenopathy.    She has no axillary adenopathy.       Right: No inguinal adenopathy present.       Left: No inguinal adenopathy present.  Neurological: She is alert and oriented to person, place, and time. She has normal motor skills, normal sensation, normal strength and normal reflexes. Gait normal.  Skin: Skin is warm and dry.  Psychiatric: Mood, memory, affect and judgment normal.    Visual Acuity Screening   Right eye Left eye Both eyes  Without correction: _0  With correction:      Results for orders placed or performed in visit on 10/04/16  POCT urine pregnancy  Result Value Ref Range   Preg Test, Ur Negative Negative    Assessment and Plan :  Annual physical exam - anticipatory guidance -   Need for Tdap vaccination - Plan: Tdap vaccine greater than or equal to 7yo IM - updated  Flu vaccine need - Plan: Flu Vaccine QUAD 36+ mos IM - updated  Screening for deficiency anemia - Plan: CBC with Differential/Platelet  Screening for metabolic disorder - Plan: CMP14+EGFR  Screening cholesterol level - Plan: Lipid panel  Family history of diet-controlled diabetes - Plan: CMP14+EGFR, TSH  Encounter for gynecological examination without abnormal finding - Plan: Pap IG and HPV (high risk) DNA detection  Abnormal menses - Plan: POCT urine pregnancy - pt has h/o irregular menses - she is ok if she gets pregnant and does not wish for birth control  Breast pain - in right breast only - I suspect that this will be hormonal and should over the next several days improve and then resolve. If she is not resolved she will contact me and at that time we will likely order mammogram - she understands and agrees   Windell Hummingbird PA-C  [facility] 10/04/2016 11:58 AM

## 2016-10-04 NOTE — Patient Instructions (Addendum)
We checked your pap smear today as well as other labs to look at your overall health.  I would watch your breast discomfort - I would suspect that you might be getting ready to start your period and this may explain why you are having this tenderness as they is   I will contact you with your lab results as soon as they are available.   If you have not heard from me in 2 weeks, please contact me.  The fastest way to get your results is to register for My Chart (see the instructions on the last page of this printout).   IF you received an x-ray today, you will receive an invoice from [facility]. Please contact [facility] at [phone] with questions or concerns regarding your invoice.   IF you received labwork today, you will receive an invoice from [employer]. Please contact [facility] at [phone] with questions or concerns regarding your invoice.   Our billing staff will not be able to assist you with questions regarding bills from these companies.  You will be contacted with the lab results as soon as they are available. The fastest way to get your results is to activate your My Chart account. Instructions are located on the last page of this paperwork. If you have not heard from [place] regarding the results in 2 weeks, please contact this office.    Health Maintenance, Female Introduction Adopting a healthy lifestyle and getting preventive care can go a long way to promote health and wellness. Talk with your health care provider about what schedule of regular examinations is right for you. This is a good chance for you to check in with your provider about disease prevention and staying healthy. In between checkups, there are plenty of things you can do on your own. Experts have done a lot of research about which lifestyle changes and preventive measures are most likely to keep you healthy. Ask your health care provider for more  information. Weight and diet Eat a healthy diet  Be sure to include plenty of vegetables, fruits, low-fat dairy products, and lean protein.  Do not eat a lot of foods high in solid fats, added sugars, or salt.  Get regular exercise. This is one of the most important things you can do for your health.  Most adults should exercise for at least 150 minutes each week. The exercise should increase your heart rate and make you sweat (moderate-intensity exercise).  Most adults should also do strengthening exercises at least twice a week. This is in addition to the moderate-intensity exercise. Maintain a healthy weight  Body mass index (BMI) is a measurement that can be used to identify possible weight problems. It estimates body fat based on height and weight. Your health care provider can help determine your BMI and help you achieve or maintain a healthy weight.  For females 51 years of age and older:  A BMI below 18.5 is considered underweight.  A BMI of 18.5 to 24.9 is normal.  A BMI of 25 to 29.9 is considered overweight.  A BMI of 30 and above is considered obese. Watch levels of cholesterol and blood lipids  You should start having your blood tested for lipids and cholesterol at 34 years of age, then have this test every 5 years.  You may need to have your cholesterol levels checked more often if:  Your lipid or cholesterol levels are high.  You are older than 34 years of  age.  Dennis Bast are at high risk for heart disease. Cancer screening Lung Cancer  Lung cancer screening is recommended for adults 40-89 years old who are at high risk for lung cancer because of a history of smoking.  A yearly low-dose CT scan of the lungs is recommended for people who:  Currently smoke.  Have quit within the past 15 years.  Have at least a 30-pack-year history of smoking. A pack year is smoking an average of one pack of cigarettes a day for 1 year.  Yearly screening should continue until  it has been 15 years since you quit.  Yearly screening should stop if you develop a health problem that would prevent you from having lung cancer treatment. Breast Cancer  Practice breast self-awareness. This means understanding how your breasts normally appear and feel.  It also means doing regular breast self-exams. Let your health care provider know about any changes, no matter how small.  If you are in your 20s or 30s, you should have a clinical breast exam (CBE) by a health care provider every 1-3 years as part of a regular health exam.  If you are 27 or older, have a CBE every year. Also consider having a breast X-ray (mammogram) every year.  If you have a family history of breast cancer, talk to your health care provider about genetic screening.  If you are at high risk for breast cancer, talk to your health care provider about having an MRI and a mammogram every year.  Breast cancer gene (BRCA) assessment is recommended for women who have family members with BRCA-related cancers. BRCA-related cancers include:  Breast.  Ovarian.  Tubal.  Peritoneal cancers.  Results of the assessment will determine the need for genetic counseling and BRCA1 and BRCA2 testing. Cervical Cancer  Your health care provider may recommend that you be screened regularly for cancer of the pelvic organs (ovaries, uterus, and vagina). This screening involves a pelvic examination, including checking for microscopic changes to the surface of your cervix (Pap test). You may be encouraged to have this screening done every 3 years, beginning at age 57.  For women ages 13-65, health care providers may recommend pelvic exams and Pap testing every 3 years, or they may recommend the Pap and pelvic exam, combined with testing for human papilloma virus (HPV), every 5 years. Some types of HPV increase your risk of cervical cancer. Testing for HPV may also be done on women of any age with unclear Pap test  results.  Other health care providers may not recommend any screening for nonpregnant women who are considered low risk for pelvic cancer and who do not have symptoms. Ask your health care provider if a screening pelvic exam is right for you.  If you have had past treatment for cervical cancer or a condition that could lead to cancer, you need Pap tests and screening for cancer for at least 20 years after your treatment. If Pap tests have been discontinued, your risk factors (such as having a new sexual partner) need to be reassessed to determine if screening should resume. Some women have medical problems that increase the chance of getting cervical cancer. In these cases, your health care provider may recommend more frequent screening and Pap tests. Colorectal Cancer  This type of cancer can be detected and often prevented.  Routine colorectal cancer screening usually begins at 34 years of age and continues through 34 years of age.  Your health care provider may recommend screening at  an earlier age if you have risk factors for colon cancer.  Your health care provider may also recommend using home test kits to check for hidden blood in the stool.  A small camera at the end of a tube can be used to examine your colon directly (sigmoidoscopy or colonoscopy). This is done to check for the earliest forms of colorectal cancer.  Routine screening usually begins at age 71.  Direct examination of the colon should be repeated every 5-10 years through 34 years of age. However, you may need to be screened more often if early forms of precancerous polyps or small growths are found. Skin Cancer  Check your skin from head to toe regularly.  Tell your health care provider about any new moles or changes in moles, especially if there is a change in a mole's shape or color.  Also tell your health care provider if you have a mole that is larger than the size of a pencil eraser.  Always use sunscreen.  Apply sunscreen liberally and repeatedly throughout the day.  Protect yourself by wearing long sleeves, pants, a wide-brimmed hat, and sunglasses whenever you are outside. Heart disease, diabetes, and high blood pressure  High blood pressure causes heart disease and increases the risk of stroke. High blood pressure is more likely to develop in:  People who have blood pressure in the high end of the normal range (130-139/85-89 mm Hg).  People who are overweight or obese.  People who are African American.  If you are 4-40 years of age, have your blood pressure checked every 3-5 years. If you are 2 years of age or older, have your blood pressure checked every year. You should have your blood pressure measured twice-once when you are at a hospital or clinic, and once when you are not at a hospital or clinic. Record the average of the two measurements. To check your blood pressure when you are not at a hospital or clinic, you can use:  An automated blood pressure machine at a pharmacy.  A home blood pressure monitor.  If you are between 46 years and 76 years old, ask your health care provider if you should take aspirin to prevent strokes.  Have regular diabetes screenings. This involves taking a blood sample to check your fasting blood sugar level.  If you are at a normal weight and have a low risk for diabetes, have this test once every three years after 34 years of age.  If you are overweight and have a high risk for diabetes, consider being tested at a younger age or more often. Preventing infection Hepatitis B  If you have a higher risk for hepatitis B, you should be screened for this virus. You are considered at high risk for hepatitis B if:  You were born in a country where hepatitis B is common. Ask your health care provider which countries are considered high risk.  Your parents were born in a high-risk country, and you have not been immunized against hepatitis B (hepatitis B  vaccine).  You have HIV or AIDS.  You use needles to inject street drugs.  You live with someone who has hepatitis B.  You have had sex with someone who has hepatitis B.  You get hemodialysis treatment.  You take certain medicines for conditions, including cancer, organ transplantation, and autoimmune conditions. Hepatitis C  Blood testing is recommended for:  Everyone born from [birth year] through 1964.  Anyone with known risk factors for hepatitis C. Sexually  transmitted infections (STIs)  You should be screened for sexually transmitted infections (STIs) including gonorrhea and chlamydia if:  You are sexually active and are younger than 34 years of age.  You are older than 34 years of age and your health care provider tells you that you are at risk for this type of infection.  Your sexual activity has changed since you were last screened and you are at an increased risk for chlamydia or gonorrhea. Ask your health care provider if you are at risk.  If you do not have HIV, but are at risk, it may be recommended that you take a prescription medicine daily to prevent HIV infection. This is called pre-exposure prophylaxis (PrEP). You are considered at risk if:  You are sexually active and do not regularly use condoms or know the HIV status of your partner(s).  You take drugs by injection.  You are sexually active with a partner who has HIV. Talk with your health care provider about whether you are at high risk of being infected with HIV. If you choose to begin PrEP, you should first be tested for HIV. You should then be tested every 3 months for as long as you are taking PrEP. Pregnancy  If you are premenopausal and you may become pregnant, ask your health care provider about preconception counseling.  If you may become pregnant, take 400 to 800 micrograms (mcg) of folic acid every day.  If you want to prevent pregnancy, talk to your health care provider about birth control  (contraception). Osteoporosis and menopause  Osteoporosis is a disease in which the bones lose minerals and strength with aging. This can result in serious bone fractures. Your risk for osteoporosis can be identified using a bone density scan.  If you are 81 years of age or older, or if you are at risk for osteoporosis and fractures, ask your health care provider if you should be screened.  Ask your health care provider whether you should take a calcium or vitamin D supplement to lower your risk for osteoporosis.  Menopause may have certain physical symptoms and risks.  Hormone replacement therapy may reduce some of these symptoms and risks. Talk to your health care provider about whether hormone replacement therapy is right for you. Follow these instructions at home:  Schedule regular health, dental, and eye exams.  Stay current with your immunizations.  Do not use any tobacco products including cigarettes, chewing tobacco, or electronic cigarettes.  If you are pregnant, do not drink alcohol.  If you are breastfeeding, limit how much and how often you drink alcohol.  Limit alcohol intake to no more than 1 drink per day for nonpregnant women. One drink equals 12 ounces of beer, 5 ounces of wine, or 1 ounces of hard liquor.  Do not use street drugs.  Do not share needles.  Ask your health care provider for help if you need support or information about quitting drugs.  Tell your health care provider if you often feel depressed.  Tell your health care provider if you have ever been abused or do not feel safe at home. This information is not intended to replace advice given to you by your health care provider. Make sure you discuss any questions you have with your health care provider. Document Released: 04/23/2011 Document Revised: 03/15/2016 Document Reviewed: 07/12/2015  2017 [employer]

## 2016-10-05 LAB — CMP14+EGFR
A/G RATIO: 1.8 (ref 1.2–2.2)
ALBUMIN: 4.9 g/dL (ref 3.5–5.5)
ALK PHOS: 86 IU/L (ref 39–117)
ALT: 23 IU/L (ref 0–32)
AST: 21 IU/L (ref 0–40)
BUN/Creatinine Ratio: 18 (ref 9–23)
BUN: 10 mg/dL (ref 6–20)
Bilirubin Total: 0.3 mg/dL (ref 0.0–1.2)
CO2: 23 mmol/L (ref 18–29)
Calcium: 10 mg/dL (ref 8.7–10.2)
Chloride: 98 mmol/L (ref 96–106)
Creatinine, Ser: 0.56 mg/dL — ABNORMAL LOW (ref 0.57–1.00)
GFR calc Af Amer: 141 mL/min/{1.73_m2} (ref >59)
GFR calc non Af Amer: 122 mL/min/{1.73_m2} (ref >59)
GLOBULIN, TOTAL: 2.7 g/dL (ref 1.5–4.5)
Glucose: 110 mg/dL — ABNORMAL HIGH (ref 65–99)
POTASSIUM: 4.7 mmol/L (ref 3.5–5.2)
SODIUM: 140 mmol/L (ref 134–144)
Total Protein: 7.6 g/dL (ref 6.0–8.5)

## 2016-10-05 LAB — CBC WITH DIFFERENTIAL/PLATELET
Basophils Absolute: 0 10*3/uL (ref 0.0–0.2)
Basos: 1 %
EOS (ABSOLUTE): 0.1 10*3/uL (ref 0.0–0.4)
EOS: 2 %
HEMATOCRIT: 46.6 % (ref 34.0–46.6)
HEMOGLOBIN: 15.5 g/dL (ref 11.1–15.9)
Immature Grans (Abs): 0 10*3/uL (ref 0.0–0.1)
Immature Granulocytes: 0 %
LYMPHS ABS: 2.1 10*3/uL (ref 0.7–3.1)
Lymphs: 32 %
MCH: 27.5 pg (ref 26.6–33.0)
MCHC: 33.3 g/dL (ref 31.5–35.7)
MCV: 83 fL (ref 79–97)
MONOCYTES: 6 %
MONOS ABS: 0.4 10*3/uL (ref 0.1–0.9)
NEUTROS ABS: 3.9 10*3/uL (ref 1.4–7.0)
Neutrophils: 59 %
Platelets: 353 10*3/uL (ref 150–379)
RBC: 5.64 x10E6/uL — ABNORMAL HIGH (ref 3.77–5.28)
RDW: 14.9 % (ref 12.3–15.4)
WBC: 6.4 10*3/uL (ref 3.4–10.8)

## 2016-10-05 LAB — LIPID PANEL
CHOLESTEROL TOTAL: 214 mg/dL — AB (ref 100–199)
Chol/HDL Ratio: 6.7 ratio units — ABNORMAL HIGH (ref 0.0–4.4)
HDL: 32 mg/dL — ABNORMAL LOW (ref >39)
LDL CALC: 105 mg/dL — AB (ref 0–99)
Triglycerides: 387 mg/dL — ABNORMAL HIGH (ref 0–149)
VLDL Cholesterol Cal: 77 mg/dL — ABNORMAL HIGH (ref 5–40)

## 2016-10-05 LAB — TSH: TSH: 1.64 u[IU]/mL (ref 0.450–4.500)

## 2016-10-09 LAB — PAP IG AND HPV HIGH-RISK
HPV, high-risk: NEGATIVE
PAP SMEAR COMMENT: 0

## 2016-10-17 ENCOUNTER — Encounter: Payer: Self-pay | Admitting: *Deleted

## 2021-11-02 ENCOUNTER — Emergency Department (HOSPITAL_COMMUNITY)
Admission: EM | Admit: 2021-11-02 | Discharge: 2021-11-03 | Disposition: A | Discharge status: Home / Self Care | Payer: Self-pay | Attending: Emergency Medicine | Admitting: Emergency Medicine

## 2021-11-02 ENCOUNTER — Emergency Department (HOSPITAL_COMMUNITY): Payer: Self-pay

## 2021-11-02 ENCOUNTER — Encounter (HOSPITAL_COMMUNITY): Payer: Self-pay

## 2021-11-02 ENCOUNTER — Other Ambulatory Visit: Payer: Self-pay

## 2021-11-02 DIAGNOSIS — Z20822 Contact with and (suspected) exposure to covid-19: Secondary | ICD-10-CM | POA: Insufficient documentation

## 2021-11-02 DIAGNOSIS — G510 Bell's palsy: Secondary | ICD-10-CM | POA: Insufficient documentation

## 2021-11-02 LAB — CBC
HCT: 44.6 % (ref 36.0–46.0)
Hemoglobin: 14.9 g/dL (ref 12.0–15.0)
MCH: 26.6 pg (ref 26.0–34.0)
MCHC: 33.4 g/dL (ref 30.0–36.0)
MCV: 79.6 fL — ABNORMAL LOW (ref 80.0–100.0)
Platelets: 323 10*3/uL (ref 150–400)
RBC: 5.6 MIL/uL — ABNORMAL HIGH (ref 3.87–5.11)
RDW: 14.2 % (ref 11.5–15.5)
WBC: 6.4 10*3/uL (ref 4.0–10.5)
nRBC: 0 % (ref 0.0–0.2)

## 2021-11-02 LAB — RAPID URINE DRUG SCREEN, HOSP PERFORMED
Amphetamines: NOT DETECTED
Barbiturates: NOT DETECTED
Benzodiazepines: NOT DETECTED
Cocaine: NOT DETECTED
Opiates: NOT DETECTED
Tetrahydrocannabinol: NOT DETECTED

## 2021-11-02 LAB — RESP PANEL BY RT-PCR (FLU A&B, COVID) ARPGX2
Influenza A by PCR: NEGATIVE
Influenza B by PCR: NEGATIVE
SARS Coronavirus 2 by RT PCR: NEGATIVE

## 2021-11-02 LAB — I-STAT CHEM 8, ED
BUN: 8 mg/dL (ref 6–20)
Calcium, Ion: 1.06 mmol/L — ABNORMAL LOW (ref 1.15–1.40)
Chloride: 107 mmol/L (ref 98–111)
Creatinine, Ser: 0.5 mg/dL (ref 0.44–1.00)
Glucose, Bld: 96 mg/dL (ref 70–99)
HCT: 46 % (ref 36.0–46.0)
Hemoglobin: 15.6 g/dL — ABNORMAL HIGH (ref 12.0–15.0)
Potassium: 3.8 mmol/L (ref 3.5–5.1)
Sodium: 138 mmol/L (ref 135–145)
TCO2: 22 mmol/L (ref 22–32)

## 2021-11-02 LAB — URINALYSIS, ROUTINE W REFLEX MICROSCOPIC
Bilirubin Urine: NEGATIVE
Glucose, UA: NEGATIVE mg/dL
Ketones, ur: 20 mg/dL — AB
Leukocytes,Ua: NEGATIVE
Nitrite: NEGATIVE
Protein, ur: NEGATIVE mg/dL
RBC / HPF: >50 RBC/hpf — ABNORMAL HIGH (ref 0–5)
Specific Gravity, Urine: 1.014 (ref 1.005–1.030)
pH: 6 (ref 5.0–8.0)

## 2021-11-02 LAB — COMPREHENSIVE METABOLIC PANEL
ALT: 34 U/L (ref 0–44)
AST: 27 U/L (ref 15–41)
Albumin: 4.4 g/dL (ref 3.5–5.0)
Alkaline Phosphatase: 62 U/L (ref 38–126)
Anion gap: 12 (ref 5–15)
BUN: 6 mg/dL (ref 6–20)
CO2: 20 mmol/L — ABNORMAL LOW (ref 22–32)
Calcium: 9.2 mg/dL (ref 8.9–10.3)
Chloride: 104 mmol/L (ref 98–111)
Creatinine, Ser: 0.53 mg/dL (ref 0.44–1.00)
GFR, Estimated: >60 mL/min (ref >60)
Glucose, Bld: 98 mg/dL (ref 70–99)
Potassium: 3.5 mmol/L (ref 3.5–5.1)
Sodium: 136 mmol/L (ref 135–145)
Total Bilirubin: 0.7 mg/dL (ref 0.3–1.2)
Total Protein: 7.2 g/dL (ref 6.5–8.1)

## 2021-11-02 LAB — PROTIME-INR
INR: 0.9 (ref 0.8–1.2)
Prothrombin Time: 12.4 seconds (ref 11.4–15.2)

## 2021-11-02 LAB — APTT: aPTT: 30 seconds (ref 24–36)

## 2021-11-02 LAB — DIFFERENTIAL
Abs Immature Granulocytes: 0.02 10*3/uL (ref 0.00–0.07)
Basophils Absolute: 0 10*3/uL (ref 0.0–0.1)
Basophils Relative: 1 %
Eosinophils Absolute: 0 10*3/uL (ref 0.0–0.5)
Eosinophils Relative: 1 %
Immature Granulocytes: 0 %
Lymphocytes Relative: 29 %
Lymphs Abs: 1.9 10*3/uL (ref 0.7–4.0)
Monocytes Absolute: 0.4 10*3/uL (ref 0.1–1.0)
Monocytes Relative: 6 %
Neutro Abs: 4.1 10*3/uL (ref 1.7–7.7)
Neutrophils Relative %: 63 %

## 2021-11-02 LAB — ETHANOL: Alcohol, Ethyl (B): <10 mg/dL (ref <10)

## 2021-11-02 LAB — I-STAT BETA HCG BLOOD, ED (MC, WL, AP ONLY): I-stat hCG, quantitative: <5 m[IU]/mL (ref <5)

## 2021-11-02 IMAGING — MR MR HEAD W/O CM
12 of 13 series · 44 of 48 positions shown · non-contrast
Comparison: [DATE] CT head, no prior MRI

CLINICAL DATA: Stroke, follow-up, headache and left facial droop

EXAM:
MRI HEAD WITHOUT CONTRAST
TECHNIQUE: Multiplanar, multiecho pulse sequences of the brain and surrounding
structures were obtained without intravenous contrast.

[Series 5: DWI · axial · 3.0mm · 0.88mm/px · z∈[-111,+42]mm · 9 of 104 slices shown (1 of 4)]
[im 1/104]
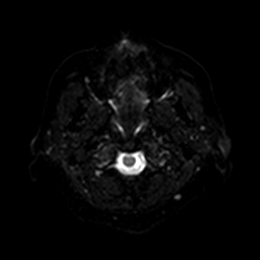
[im 13/104]
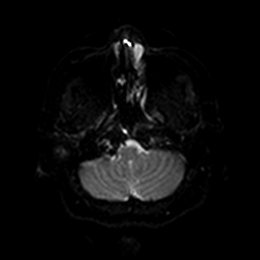
[im 26/104]
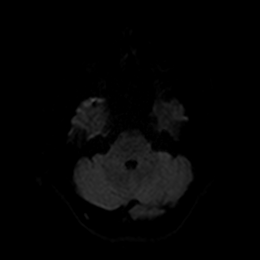
[im 39/104]
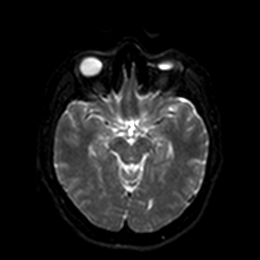
[im 52/104]
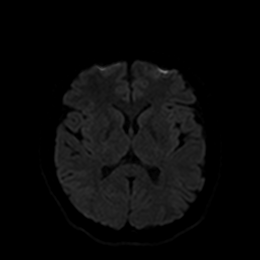
[im 65/104]
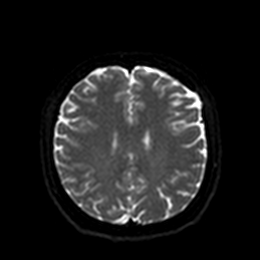
[im 78/104]
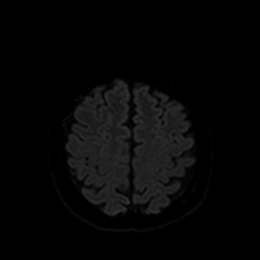
[im 91/104]
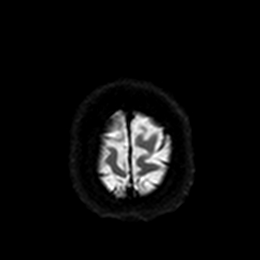
[im 104/104]
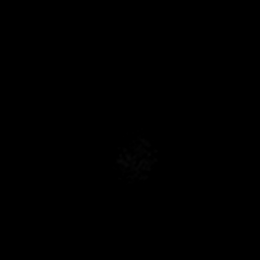

[Series 6: DWI · axial · 3.0mm · 0.88mm/px · z∈[-111,+42]mm · 4 of 52 slices shown (2 of 4)]
[im 1/52]
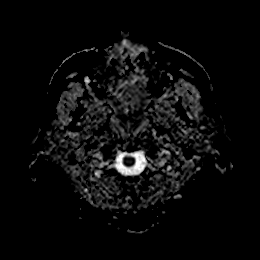
[im 18/52]
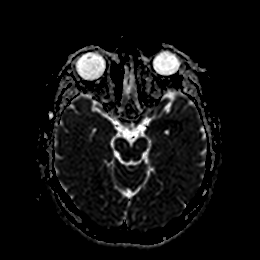
[im 35/52]
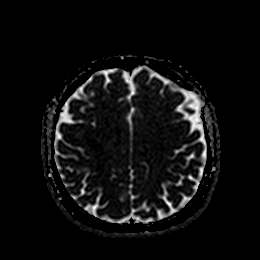
[im 52/52]
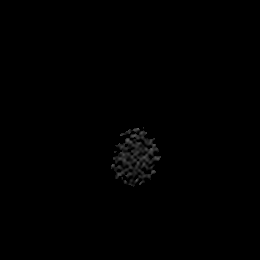

[Series 7: DWI · coronal · 4.0mm · 0.88mm/px · 5 of 64 slices shown (3 of 4)]
[im 1/64]
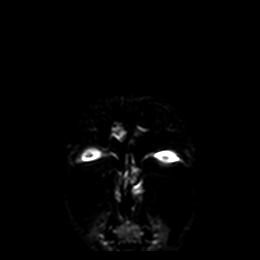
[im 16/64]
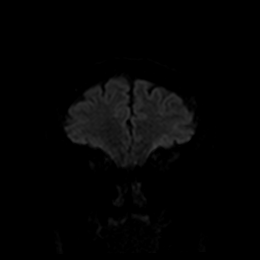
[im 32/64]
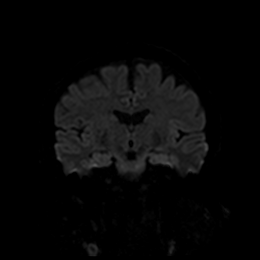
[im 48/64]
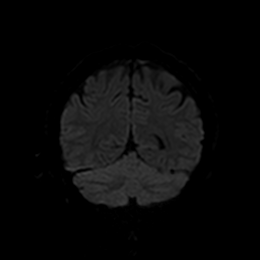
[im 64/64]
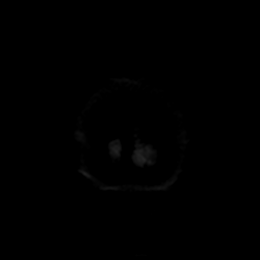

[Series 8: DWI · coronal · 4.0mm · 0.88mm/px · 2 of 32 slices shown (4 of 4)]
[im 1/32]
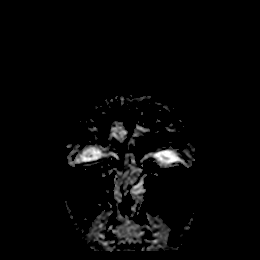
[im 32/32]
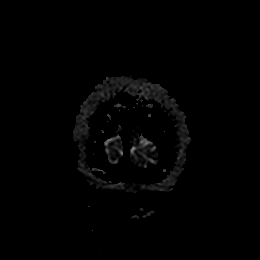

[Series 9: T1 · sagittal · 5.0mm · 0.75mm/px · 2 of 26 slices shown]
[im 1/26]
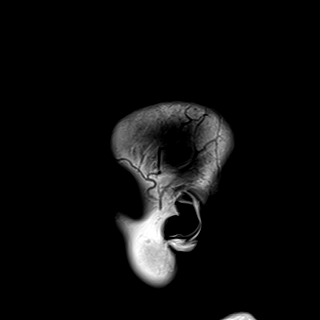
[im 26/26]
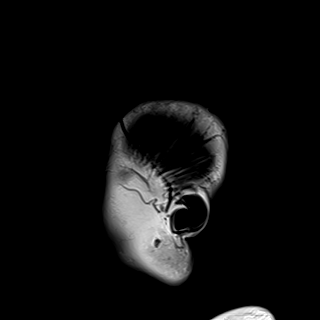

[Series 10: T2 · axial · 5.0mm · 0.72mm/px · z∈[-109,+40]mm · 2 of 26 slices shown (1 of 2)]
[im 1/26]
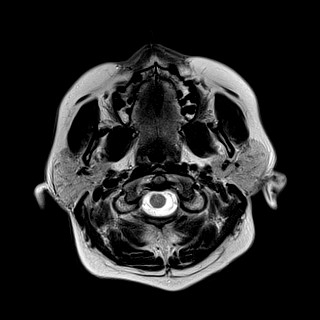
[im 26/26]
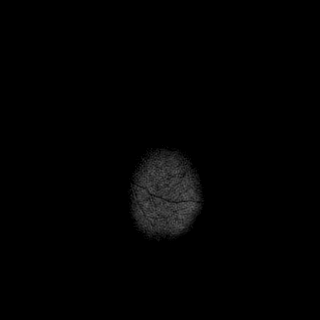

[Series 11: FLAIR · axial · 5.0mm · 0.45mm/px · z∈[-110,+39]mm · 2 of 26 slices shown]
[im 1/26]
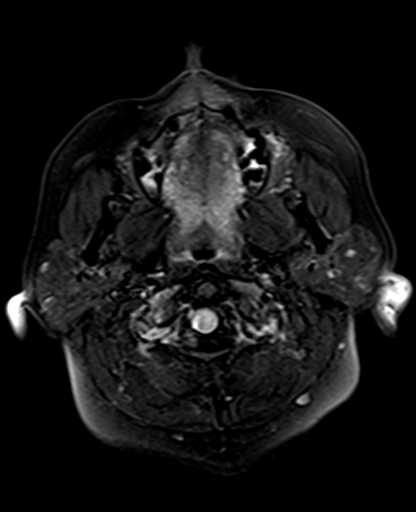
[im 26/26]
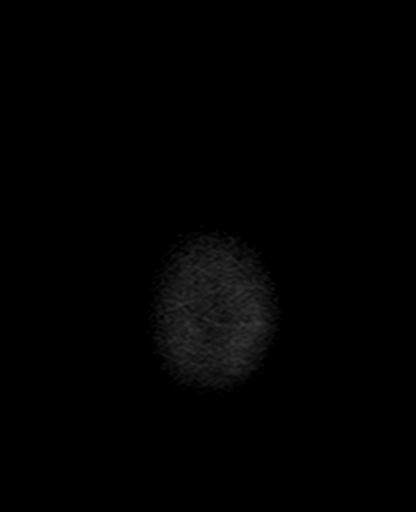

[Series 12: mag_images · axial · 3.0mm · 0.90mm/px · z∈[-111,+41]mm · 4 of 52 slices shown]
[im 1/52]
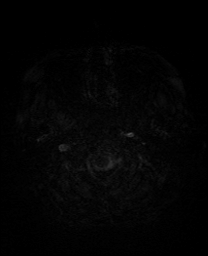
[im 18/52]
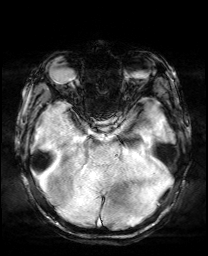
[im 35/52]
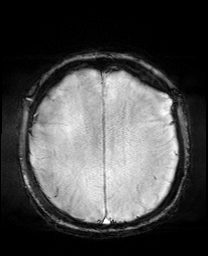
[im 52/52]
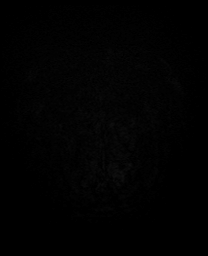

[Series 13: pha_images · axial · 3.0mm · 0.90mm/px · z∈[-111,+38]mm · 4 of 51 slices shown]
[im 1/51]
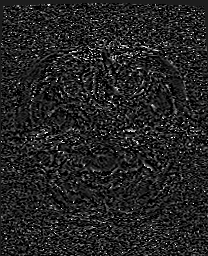
[im 17/51]
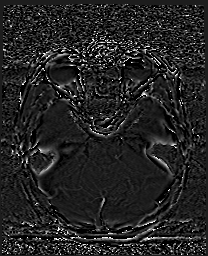
[im 34/51]
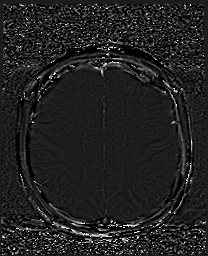
[im 51/51]
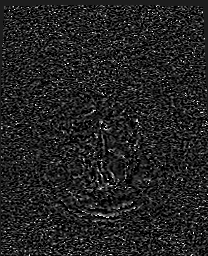

[Series 14: swi_images · axial · 3.0mm · 0.90mm/px · z∈[-111,+41]mm · 4 of 52 slices shown]
[im 1/52]
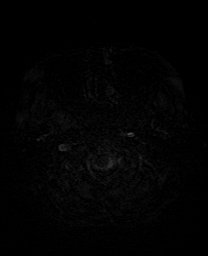
[im 18/52]
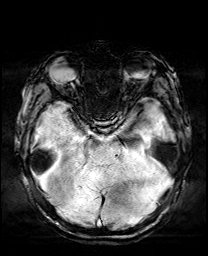
[im 35/52]
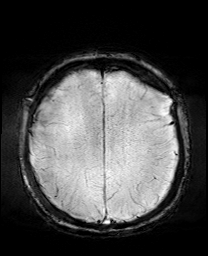
[im 52/52]
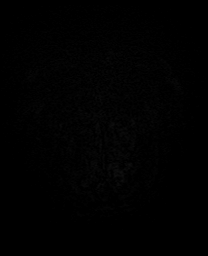

[Series 15: mip_images(sw) · axial · 24.0mm · 0.90mm/px · z∈[-101,+31]mm · 4 of 45 slices shown]
[im 1/45]
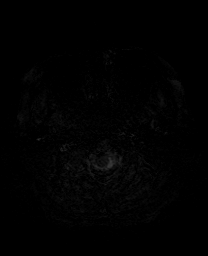
[im 15/45]
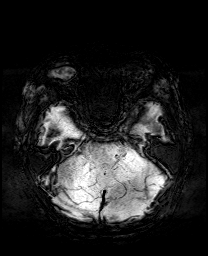
[im 30/45]
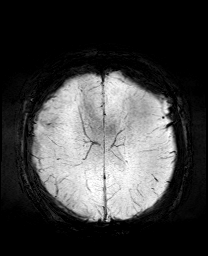
[im 45/45]
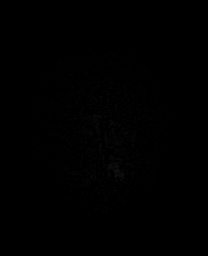

[Series 17: T2 · coronal · 5.0mm · 0.34mm/px · 2 of 29 slices shown (2 of 2)]
[im 1/29]
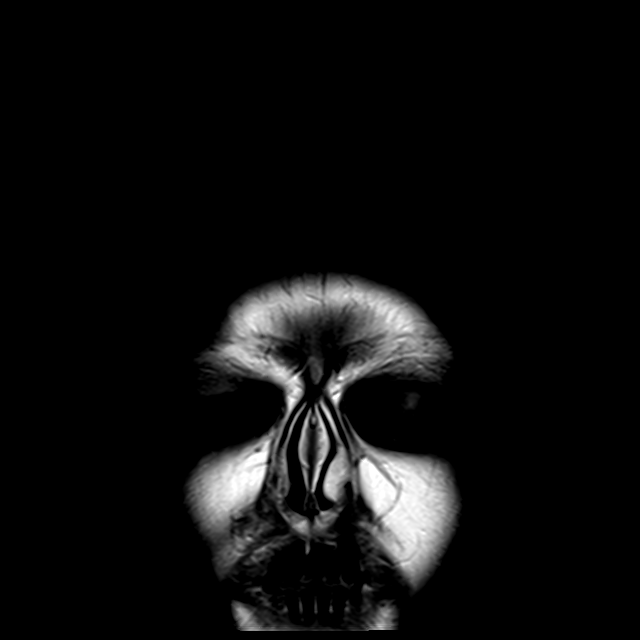
[im 29/29]
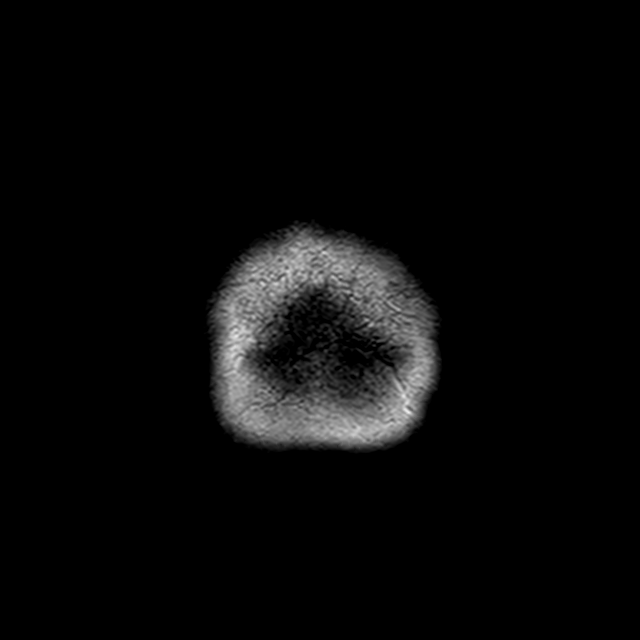

[44 of 48 positions shown; findings below may reference images not displayed]

FINDINGS: Brain: No restricted diffusion to suggest acute or subacute infarct.
No acute hemorrhage, mass, mass effect, or midline shift. No
hydrocephalus or extra-axial collection. Cystic focus in the left
lentiform nucleus, most likely a dilated perivascular space. No foci
of hemosiderin deposition to suggest remote hemorrhage.

Vascular: Normal flow voids.

Skull and upper cervical spine: Normal marrow signal.

Sinuses/Orbits: Negative.

Other: The mastoids are well aerated.
IMPRESSION: No acute intracranial process. No etiology is seen for the patient's
headache and left facial droop.

## 2021-11-02 MED ORDER — DEXAMETHASONE SODIUM PHOSPHATE 10 MG/ML IJ SOLN
10.0000 mg | Freq: Once | INTRAMUSCULAR | Status: AC
  Administered 2021-11-03: 10 mg via INTRAVENOUS
  Filled 2021-11-02: qty 1

## 2021-11-02 MED ORDER — SODIUM CHLORIDE 0.9 % IV BOLUS
1000.0000 mL | Freq: Once | INTRAVENOUS | Status: AC
  Administered 2021-11-03: 1000 mL via INTRAVENOUS

## 2021-11-02 MED ORDER — VALACYCLOVIR HCL 1 G PO TABS: 1000.0000 mg | ORAL_TABLET | Freq: Three times a day (TID) | ORAL | 0 refills | Status: DC

## 2021-11-02 MED ORDER — PROCHLORPERAZINE EDISYLATE 10 MG/2ML IJ SOLN
10.0000 mg | Freq: Once | INTRAMUSCULAR | Status: AC
  Administered 2021-11-03: 10 mg via INTRAVENOUS
  Filled 2021-11-02: qty 2

## 2021-11-02 MED ORDER — VALACYCLOVIR HCL 500 MG PO TABS
1000.0000 mg | ORAL_TABLET | Freq: Once | ORAL | Status: AC
Start: 2021-11-02 — End: 2021-11-02
  Administered 2021-11-02: 1000 mg via ORAL
  Filled 2021-11-02: qty 2

## 2021-11-02 MED ORDER — PREDNISONE 20 MG PO TABS
40.0000 mg | ORAL_TABLET | Freq: Every day | ORAL | 0 refills | Status: AC
Start: 2021-11-02 — End: 2021-11-07

## 2021-11-02 MED ORDER — LORAZEPAM 1 MG PO TABS: 0.5000 mg | ORAL_TABLET | Freq: Once | ORAL | Status: DC

## 2021-11-02 NOTE — ED Triage Notes (Signed)
Pt arrived POV from home c/o a headache and a left facial droop. Pt's LKW was yesterday morning. Pt stated she noticed the facial droop starting last night. No other neuro deficits noted.

## 2021-11-02 NOTE — ED Notes (Signed)
Pt callled x3 for vitals recheck, no response.

## 2021-11-02 NOTE — ED Provider Notes (Signed)
[facility] EMERGENCY DEPARTMENT Provider Note   CSN: [id number] Arrival date & time: 11/02/21  1557     History  Chief Complaint  Patient presents with   Facial Droop    Christine Roach is a 41 y.o. female.  HPI Patient presents with concern of left facial droop.  Symptoms began 3 days ago with pain in the left infra-auricular region.  Subsequently she developed mild headache, and eventually difficulty with chewing, holding liquids, and noticed left facial droop.  Since that time symptoms have been persistent, no relief with OTC medication.  No extremity weakness, no confusion, no speech difficulty.  No vision loss.    Home Medications Prior to Admission medications   Not on File      Allergies    Patient has no known allergies.    Review of Systems   Review of Systems  Constitutional:        Per HPI, otherwise negative  HENT:         Per HPI, otherwise negative  Respiratory:         Per HPI, otherwise negative  Cardiovascular:        Per HPI, otherwise negative  Gastrointestinal:  Negative for vomiting.  Endocrine:       Negative aside from HPI  Genitourinary:        Neg aside from HPI   Musculoskeletal:        Per HPI, otherwise negative  Skin: Negative.   Neurological:  Positive for facial asymmetry. Negative for syncope.   Physical Exam Updated Vital Signs BP 117/85    Pulse 71    Temp 98.3 F (36.8 C) (Oral)    Resp 14    SpO2 97%  Physical Exam Vitals and nursing note reviewed.  Constitutional:      General: She is not in acute distress.    Appearance: She is well-developed.  HENT:     Head: Normocephalic and atraumatic.   Eyes:     Conjunctiva/sclera: Conjunctivae normal.  Cardiovascular:     Rate and Rhythm: Normal rate and regular rhythm.  Pulmonary:     Effort: Pulmonary effort is normal. No respiratory distress.     Breath sounds: Normal breath sounds. No stridor.  Abdominal:     General: There is no distension.   Skin:    General: Skin is warm and dry.  Neurological:     Mental Status: She is alert and oriented to person, place, and time.     Cranial Nerves: Facial asymmetry present. No dysarthria.     Sensory: No sensory deficit.     Motor: No tremor.     Comments: Remedies and exam beyond the patient's cranial nerves are unremarkable.    ED Results / Procedures / Treatments   Labs (all labs ordered are listed, but only abnormal results are displayed) Labs Reviewed  CBC - Abnormal; Notable for the following components:      Result Value   RBC 5.60 (*)    MCV 79.6 (*)    All other components within normal limits  COMPREHENSIVE METABOLIC PANEL - Abnormal; Notable for the following components:   CO2 20 (*)    All other components within normal limits  URINALYSIS, ROUTINE W REFLEX MICROSCOPIC - Abnormal; Notable for the following components:   Hgb urine dipstick LARGE (*)    Ketones, ur 20 (*)    RBC / HPF >50 (*)    Bacteria, UA RARE (*)    All other  components within normal limits  I-STAT CHEM 8, ED - Abnormal; Notable for the following components:   Calcium, Ion 1.06 (*)    Hemoglobin 15.6 (*)    All other components within normal limits  RESP PANEL BY RT-PCR (FLU A&B, COVID) ARPGX2  ETHANOL  PROTIME-INR  APTT  DIFFERENTIAL  RAPID URINE DRUG SCREEN, HOSP PERFORMED  I-STAT BETA HCG BLOOD, ED ([facility], [facility], [facility] ONLY)    EKG EKG Interpretation  Date/Time:  Thursday November 02 2021 16:35:42 EST Ventricular Rate:  92 PR Interval:  140 QRS Duration: 84 QT Interval:  388 QTC Calculation: 479 R Axis:   37 Text Interpretation: Normal sinus rhythm Normal ECG When compared with ECG of 18-May-2012 01:13, PREVIOUS ECG IS PRESENT Confirmed by Carmin Muskrat [phone]) on 11/02/2021 10:00:07 PM  Radiology CT HEAD WO CONTRAST  Result Date: 11/02/2021 CLINICAL DATA:  Neuro deficit, acute, stroke suspected Headache and left facial droop. EXAM: CT HEAD WITHOUT CONTRAST TECHNIQUE: Contiguous axial  images were obtained from the base of the skull through the vertex without intravenous contrast. RADIATION DOSE REDUCTION: This exam was performed according to the departmental dose-optimization program which includes automated exposure control, adjustment of the mA and/or kV according to patient size and/or use of iterative reconstruction technique. COMPARISON:  None. FINDINGS: Brain: No intracranial hemorrhage, mass effect, or midline shift. No hydrocephalus. The basilar cisterns are patent. No evidence of territorial infarct or acute ischemia. No extra-axial or intracranial fluid collection. Vascular: No hyperdense vessel or unexpected calcification. Skull: No fracture or focal lesion. Sinuses/Orbits: Paranasal sinuses and mastoid air cells are clear. The visualized orbits are unremarkable. Other: None. IMPRESSION: Negative noncontrast head CT. Electronically Signed   By: Keith Rake M.D.   On: 11/02/2021 17:17   MR BRAIN WO CONTRAST  Result Date: 11/02/2021 CLINICAL DATA:  Stroke, follow-up, headache and left facial droop EXAM: MRI HEAD WITHOUT CONTRAST TECHNIQUE: Multiplanar, multiecho pulse sequences of the brain and surrounding structures were obtained without intravenous contrast. COMPARISON:  11/02/2021 CT head, no prior MRI FINDINGS: Brain: No restricted diffusion to suggest acute or subacute infarct. No acute hemorrhage, mass, mass effect, or midline shift. No hydrocephalus or extra-axial collection. Cystic focus in the left lentiform nucleus, most likely a dilated perivascular space. No foci of hemosiderin deposition to suggest remote hemorrhage. Vascular: Normal flow voids. Skull and upper cervical spine: Normal marrow signal. Sinuses/Orbits: Negative. Other: The mastoids are well aerated. IMPRESSION: No acute intracranial process. No etiology is seen for the patient's headache and left facial droop. Electronically Signed   By: Merilyn Baba M.D.   On: 11/02/2021 19:51     Procedures Procedures    Medications Ordered in ED Medications  LORazepam (ATIVAN) tablet 0.5 mg (has no administration in time range)  valACYclovir (VALTREX) tablet 1,000 mg (has no administration in time range)  sodium chloride 0.9 % bolus 1,000 mL (has no administration in time range)  dexamethasone (DECADRON) injection 10 mg (has no administration in time range)  prochlorperazine (COMPAZINE) injection 10 mg (has no administration in time range)    ED Course/ Medical Decision Making/ A&P Cardiac monitor 90s sinus normal Pulse ox 100% room air normal These are both unremarkable values. On repeat exam patient is calm.                         Medical Decision Making Adult female generally well until a few days ago presents with new headache, left facial asymmetry.  Patient's risk profile  is minimal, but with consideration of this, she had MRI, CT scan performed, I have interpreted both sets of images, no notable findings including stroke.  Labs reassuring, as is the EKG, and absence of chest pain low suspicion for ischemia or arrhythmia.  Some suspicion for Bell's palsy contributing to the patient's presentation.  Patient received IV Compazine, Decadron, oral Valtrex, had improvement in her headache, and without evidence for intracranial pathology was appropriate for discharge with outpatient for as needed.  She has no acknowledged barriers to care       Final Clinical Impression(s) / ED Diagnoses Final diagnoses:  Bell's palsy    Rx / DC Orders ED Discharge Orders          Ordered    valACYclovir (VALTREX) 1000 MG tablet  3 times daily        11/02/21 2328    predniSONE (DELTASONE) 20 MG tablet  Daily with breakfast        11/02/21 2328              Carmin Muskrat, MD 11/02/21 2328

## 2021-11-02 NOTE — ED Provider Triage Note (Signed)
Emergency Medicine Provider Triage Evaluation Note  Christine Roach , a 41 y.o. female  was evaluated in triage.  Pt complains of Left facial droop yesterday around 1600. She had an associated headache that started around the same time. She states that her symptoms have worsened since yesterday. She denies any vision changes, speech changes, weakness or numbness, chest pain, shortness of breath, abdominal pain, nausea, or balance issues.  Review of Systems  Positive: Facial droop, headache Negative:   Physical Exam  There were no vitals taken for this visit. Gen:   Awake, no distress   Resp:  Normal effort  MSK:   Moves extremities without difficulty  Other:   Alert and Oriented x 3 Speech clear with no aphasia Cranial Nerve testing - Visual Fields grossly intact - PERRLA. EOM intact. No Nystagmus - Facial Sensation grossly intact - left facial droop. Does not involve eyebrows - Uvula and Tongue Midline - Accessory Muscles intact Motor: - 5/5 motor strength in all four extremities.  - No pronator Drift - Normal tone Sensation: - Grossly intact in all four extremities.  Coordination:  - Gait without abnormality.   Medical Decision Making  Medically screening exam initiated at 4:15 PM.  Appropriate orders placed.  Christine Roach was informed that the remainder of the evaluation will be completed by another provider, this initial triage assessment does not replace that evaluation, and the importance of remaining in the ED until their evaluation is complete.  Out of stroke window as it has been 24 hours. Will order brain imaging and labs.    Claudie Leach, [place] 11/02/21 [phone]

## 2021-11-03 NOTE — ED Notes (Signed)
Patient verbalizes understanding of d/c instructions. Opportunities for questions and answers were provided. Pt d/c from ED and ambulated to lobby where family will be picking her up.

## 2024-03-02 NOTE — Progress Notes (Signed)
 Hepatology Clinic Initial Visit  Christine Roach is a 43 y.o. female seen for an initial consultation at the request of Christine Roach for hepatic steatosis.  She attends today's visit unaccompanied.  HISTORY OF PRESENT ILLNESS: Christine Roach is a 43 y.o. female with history of HLD, T2DM, and obesity presenting for evaluation of hepatic steatosis.  Christine Roach reports first hearing of possible liver issue 11/2023 when she was found to have elevated liver enzymes. She then underwent US  01/02/2024 which demonstrated hepatic steatosis, no focal lesions, no ascites. Serologies negative for chronic HCV.   She does not consume alcohol. She reports eating a lot of junk food this year after the passing of her father, Christine Roach has been working on this and has lost about 19 lb since February. She takes Metformin for diabetes (last A1C). There is no known family history of liver disease, liver cancer, or colon cancer.   She reports no symptoms of advanced liver disease- no jaundice, no edema, no hematemesis/hematochezia/melena, no encephalopathy.  No Known Allergies   No past medical history on file.  Past Surgical History:  Procedure Laterality Date  . CESAREAN SECTION    . CHOLECYSTECTOMY      Current Outpatient Medications  Medication Sig Dispense Refill  . ascorbic acid (VITAMIN C) 1,000 mg tablet Take 1,000 mg by mouth daily.    . ferrous sulfate 325 mg (65 mg iron) tablet Take 1 tablet by mouth daily.    . metFORMIN (GLUCOPHAGE) 1,000 mg tablet Take 1,000 mg by mouth in the morning and 1,000 mg in the evening. Take with meals.    SABRA omega 3-dha-epa-fish oil (OMEGA 3) 1,000 mg DR capsule Take 1 capsule by mouth daily.     No current facility-administered medications for this visit.    Social History   Socioeconomic History  . Marital status: Married    Spouse name: Not on file  . Number of children: Not on file  . Years of education: Not on file  . Highest education level: Not  on file  Occupational History  . Not on file  Tobacco Use  . Smoking status: Never  . Smokeless tobacco: Never  Substance and Sexual Activity  . Alcohol use: Never  . Drug use: Never  . Sexual activity: Not on file  Other Topics Concern  . Not on file  Social History Narrative  . Not on file   Social Drivers of Health   Food Insecurity: Not on file  Transportation Needs: Not on file  Safety: Not on file  Living Situation: Not on file    Family History  Problem Relation Name Age of Onset  . Liver cancer Neg Hx    . Liver disease Neg Hx    . Colon cancer Neg Hx      REVIEW OF SYSTEMS: see HPI.  PHYSICAL EXAM: Vital Signs: Vitals: BP: 127/76, Temp: 97.8 F (36.6 C), Temp Source: Temporal, Heart Rate: 87, SpO2: 100 %, Height: 1.676 m (5' 6), Weight: 89.2 kg (196 lb 9.6 oz); BMI (Calculated): 31.7 Constitutional: She is in no apparent distress.  Eyes: Anicteric sclerae. Respiratory: Clear lungs to auscultation bilaterally. No wheezes, rales, rhonchi, or crackles.  Cardiovascular: Normal S1 and S2. Regular rate and rhythm. No murmurs, rubs or gallops. No peripheral edema. Gastrointestinal:  Abdomen soft, non-tender, and non-distended.  No hepatomegaly, hernias, or masses palpable. Hem/Lymphatic: No cervical or supraclavicular lymphadenopathy. Musculoskeletal: No clubbing or cyanosis of hands. Normal range of motion in upper and lower extremities.  Skin: No cutaneous stigmata of chronic liver disease, no rashes. Neurologic: Fluent language. No facial asymmetry. Moves all extremities equally. No asterixis. Psychiatric: Alert. Normal affect.  RECORDS REVIEW: I have personally reviewed records from [facility], [place], [facility]. These are summarized within the history of present illness and below.  LABS:  12/23/2023: Ferritin 24 TIBC 455 UIBC 396  Iron 59 Iron saturation 13 GGT 27  12/09/2023: WBC 4.9 RBC 5.59  Hgb 14.3 PLT 340  Creatinine 0.57 EGFR 117 Sodium  138  Potassium 4.6 Albumin 4.6 Total bilirubin 0.4 ALP 79 AST 30 ALT 45 (H) Hep C virus antibody nonreactive    IMAGING: US  Abdomen limited (01/02/2024): FINDINGS:  PANCREAS:  No focal abnormalities are identified.  Visualization is limited due to overlying bowel gas.  VASCULATURE:  No abdominal aortic aneurysm.  Visualized IVC is patent.  Portal vein is patent with normal flow direction.  HEPATOBILIARY:  Liver: The visualized liver appears echogenic.  Gallbladder: Prior cholecystectomy.  CBD: Normal.  No intrahepatic biliary ductal dilatation.  RIGHT KIDNEY:  Right kidney is of normal size.  No hydronephrosis.  No suspicious masses.  Normal renal echotexture.  MISC:  N./A.  IMPRESSION:  1. The visualized liver appears echogenic, suggesting hepatic steatosis or other parenchymal disease.  2.  Prior cholecystectomy.    ASSESSMENT AND PLAN: Christine Roach is a 43 y.o. female with history of HLD, T2DM, and obesity presenting for evaluation of elevated liver enzymes and hepatic steatosis.  Abnormal liver tests: Elevations in LFTs since 11/2023 with normal total bili and PLT.  US  01/02/2024 demonstrated hepatic steatosis, no focal lesions, no ascites. Serologies negative for chronic HCV.   The differential diagnosis for abnormal liver tests includes nonalcoholic fatty liver disease, alcohol-associated liver disease, autoimmune hepatitis, hemochromatosis, alpha-1-antitrypsin deficiency, viral hepatitis, and Wilson disease. She has metabolic risk factors for nonalcoholic fatty liver disease including DM, HLD, and obesity. She des not consume alcohol.   FIB 4 score = 0.55 - advanced fibrosis unlikely. If her liver tests remain abnormal and if the above studies do not provide us  with a definitive diagnosis, we may consider performing a liver biopsy.  Mainstay of therapy in MASLD is weight loss through diet/exercise and aggressive pharmacologic management of metabolic syndrome  through PCP.   Plan:  -Weight loss through diet/exercise as tolerated -Aggressive pharmacologic management of metabolic syndrome components through PCP - Comprehensive Metabolic Panel; Future - CBC without Differential; Future - Ceruloplasmin; Future - Hepatitis B Surface Antibody, Qualitative; Future - Hepatitis A Virus (HAV) Antibody, Total with Reflex to HAV IGM; Future   Return for Pending lab and/or imaging results.    Lauraine Theo Norlander, PA-C

## 2024-03-27 NOTE — Telephone Encounter (Signed)
 Spanish Interpreter Alfanzo [phone]  Called mobile number listed, left a message on unidentified VM.

## 2024-03-27 NOTE — Telephone Encounter (Signed)
 Patient returned call.  Advised patient I needed to go over results with her and asked her if she would feel better with an interpretor.  She advised yes. Conferenced Spanish Claudia Charleston [phone] in with our call.  Went over the results as outlined below. Patient verbalized understanding and appreciation.

## 2024-03-27 NOTE — Telephone Encounter (Signed)
-----   Message from Lauraine Norlander, PA-C sent at 03/09/2024 11:16 AM EDT ----- Good news- liver enzymes have improved. She is immune to HAV but not hepatitis B and should consider vaccination.  Otherwise no concerning findings on labs.  Continue working on healthy lifestyle and weight loss.

## 2024-05-20 ENCOUNTER — Encounter (HOSPITAL_COMMUNITY): Payer: Self-pay | Admitting: *Deleted

## 2024-05-20 ENCOUNTER — Inpatient Hospital Stay (HOSPITAL_COMMUNITY)
Admission: EM | Admit: 2024-05-20 | Discharge: 2024-05-21 | Disposition: A | Discharge status: Home / Self Care | Payer: Self-pay | Attending: Obstetrics & Gynecology | Admitting: Obstetrics & Gynecology

## 2024-05-20 ENCOUNTER — Inpatient Hospital Stay (HOSPITAL_COMMUNITY): Payer: Self-pay

## 2024-05-20 DIAGNOSIS — O09521 Supervision of elderly multigravida, first trimester: Secondary | ICD-10-CM | POA: Insufficient documentation

## 2024-05-20 DIAGNOSIS — O26891 Other specified pregnancy related conditions, first trimester: Secondary | ICD-10-CM | POA: Insufficient documentation

## 2024-05-20 DIAGNOSIS — Z3A01 Less than 8 weeks gestation of pregnancy: Secondary | ICD-10-CM | POA: Insufficient documentation

## 2024-05-20 DIAGNOSIS — O208 Other hemorrhage in early pregnancy: Secondary | ICD-10-CM | POA: Insufficient documentation

## 2024-05-20 DIAGNOSIS — O3680X0 Pregnancy with inconclusive fetal viability, not applicable or unspecified: Secondary | ICD-10-CM | POA: Insufficient documentation

## 2024-05-20 DIAGNOSIS — O26899 Other specified pregnancy related conditions, unspecified trimester: Secondary | ICD-10-CM

## 2024-05-20 DIAGNOSIS — R1031 Right lower quadrant pain: Secondary | ICD-10-CM | POA: Insufficient documentation

## 2024-05-20 DIAGNOSIS — O209 Hemorrhage in early pregnancy, unspecified: Secondary | ICD-10-CM

## 2024-05-20 LAB — COMPREHENSIVE METABOLIC PANEL WITH GFR
ALT: 17 U/L (ref 0–44)
AST: 17 U/L (ref 15–41)
Albumin: 3.8 g/dL (ref 3.5–5.0)
Alkaline Phosphatase: 45 U/L (ref 38–126)
Anion gap: 8 (ref 5–15)
BUN: 10 mg/dL (ref 6–20)
CO2: 20 mmol/L — ABNORMAL LOW (ref 22–32)
Calcium: 9.3 mg/dL (ref 8.9–10.3)
Chloride: 108 mmol/L (ref 98–111)
Creatinine, Ser: 0.81 mg/dL (ref 0.44–1.00)
GFR, Estimated: >60 mL/min (ref >60)
Glucose, Bld: 117 mg/dL — ABNORMAL HIGH (ref 70–99)
Potassium: 3.8 mmol/L (ref 3.5–5.1)
Sodium: 136 mmol/L (ref 135–145)
Total Bilirubin: 0.5 mg/dL (ref 0.0–1.2)
Total Protein: 6.4 g/dL — ABNORMAL LOW (ref 6.5–8.1)

## 2024-05-20 LAB — CBC WITH DIFFERENTIAL/PLATELET
Abs Immature Granulocytes: 0.02 K/uL (ref 0.00–0.07)
Basophils Absolute: 0 K/uL (ref 0.0–0.1)
Basophils Relative: 0 %
Eosinophils Absolute: 0 K/uL (ref 0.0–0.5)
Eosinophils Relative: 1 %
HCT: 41.5 % (ref 36.0–46.0)
Hemoglobin: 14.5 g/dL (ref 12.0–15.0)
Immature Granulocytes: 0 %
Lymphocytes Relative: 26 %
Lymphs Abs: 2 K/uL (ref 0.7–4.0)
MCH: 29.3 pg (ref 26.0–34.0)
MCHC: 34.9 g/dL (ref 30.0–36.0)
MCV: 83.8 fL (ref 80.0–100.0)
Monocytes Absolute: 0.5 K/uL (ref 0.1–1.0)
Monocytes Relative: 6 %
Neutro Abs: 5.2 K/uL (ref 1.7–7.7)
Neutrophils Relative %: 67 %
Platelets: 252 K/uL (ref 150–400)
RBC: 4.95 MIL/uL (ref 3.87–5.11)
RDW: 13.7 % (ref 11.5–15.5)
WBC: 7.8 K/uL (ref 4.0–10.5)
nRBC: 0 % (ref 0.0–0.2)

## 2024-05-20 LAB — URINALYSIS, ROUTINE W REFLEX MICROSCOPIC
Bilirubin Urine: NEGATIVE
Glucose, UA: NEGATIVE mg/dL
Ketones, ur: NEGATIVE mg/dL
Nitrite: NEGATIVE
Protein, ur: NEGATIVE mg/dL
Specific Gravity, Urine: 1.009 (ref 1.005–1.030)
pH: 6 (ref 5.0–8.0)

## 2024-05-20 LAB — WET PREP, GENITAL
Clue Cells Wet Prep HPF POC: NONE SEEN
Sperm: NONE SEEN
Trich, Wet Prep: NONE SEEN
WBC, Wet Prep HPF POC: >=10 — AB (ref <10)
Yeast Wet Prep HPF POC: NONE SEEN

## 2024-05-20 LAB — HCG, QUANTITATIVE, PREGNANCY: hCG, Beta Chain, Quant, S: 42010 m[IU]/mL — ABNORMAL HIGH (ref <5)

## 2024-05-20 LAB — POC URINE PREG, ED: Preg Test, Ur: POSITIVE — AB

## 2024-05-20 NOTE — ED Triage Notes (Signed)
 Pt reports she is [redacted] weeks pregnant and noticed some bleeding starting yesterday

## 2024-05-20 NOTE — MAU Provider Note (Signed)
 Chief Complaint: Threatened Miscarriage   Event Date/Time   First Provider Initiated Contact with Patient 05/20/24 2144        SUBJECTIVE HPI: Christine Roach is a 43 y.o. [id number] at [redacted]w[redacted]d by LMP who presents to maternity admissions reporting ***. She denies vaginal bleeding, vaginal itching/burning, urinary symptoms, h/a, dizziness, n/v, or fever/chills.     HPI  History reviewed. No pertinent past medical history. Past Surgical History:  Procedure Laterality Date   CESAREAN SECTION     CHOLECYSTECTOMY  08/25/2012   Procedure: LAPAROSCOPIC CHOLECYSTECTOMY WITH INTRAOPERATIVE CHOLANGIOGRAM;  Surgeon: Elspeth KYM Schultze, MD;  Location: [facility] OR;  Service: General;  Laterality: N/A;   Social History   Socioeconomic History   Marital status: Married    Spouse name: Bette   Number of children: 3   Years of education: Not on file   Highest education level: Not on file  Occupational History   Not on file  Tobacco Use   Smoking status: Never   Smokeless tobacco: Never  Substance and Sexual Activity   Alcohol use: No   Drug use: No   Sexual activity: Yes    Partners: Male    Birth control/protection: Condom  Other Topics Concern   Not on file  Social History Narrative   Marital status: married - Bette - moved to [place]  from [place] 2006   Children: 3 boys   Lives with: husband and children   Employment: stays at home   Tobacco:  no   Alcohol:  no   Drugs:  no   Exercise:  no   Seatbelt: 100%   Guns in home: no   Social Drivers of Corporate investment banker Strain: Not on file  Food Insecurity: Not on file  Transportation Needs: Not on file  Physical Activity: Not on file  Stress: Not on file  Social Connections: Not on file  Intimate Partner Violence: Not on file   No current facility-administered medications on file prior to encounter.   Current Outpatient Medications on File Prior to Encounter  Medication Sig Dispense Refill   valACYclovir  (VALTREX ) 1000 MG  tablet Take 1 tablet (1,000 mg total) by mouth 3 (three) times daily. 21 tablet 0   No Known Allergies  I have reviewed patient's Past Medical Hx, Surgical Hx, Family Hx, Social Hx, medications and allergies.   ROS:  Review of Systems Review of Systems  Other systems negative   Physical Exam  Physical Exam Patient Vitals for the past 24 hrs:  BP Temp Pulse Resp SpO2 Height Weight  05/20/24 2029 -- -- -- -- -- 5' 5 (1.651 m) 85.7 kg  05/20/24 2016 123/78 98 F (36.7 C) 81 20 97 % -- --   Constitutional: Well-developed, well-nourished female in no acute distress.  Cardiovascular: normal rate Respiratory: normal effort GI: Abd soft, non-tender. Pos BS x 4 MS: Extremities nontender, no edema, normal ROM Neurologic: Alert and oriented x 4.  GU: Neg CVAT.  PELVIC EXAM: Cervix pink, visually closed, without lesion, scant white creamy discharge, vaginal walls and external genitalia normal Bimanual exam: Cervix 0/long/high, firm, anterior, neg CMT, uterus nontender, nonenlarged, adnexa without tenderness, enlargement, or mass  FHT *** by doppler  LAB RESULTS Results for orders placed or performed during the hospital encounter of 05/20/24 (from the past 24 hours)  Urinalysis, Routine w reflex microscopic -Urine, Clean Catch     Status: Abnormal   Collection Time: 05/20/24  8:39 PM  Result Value Ref Range   Color,  Urine YELLOW YELLOW   APPearance CLEAR CLEAR   Specific Gravity, Urine 1.009 1.005 - 1.030   pH 6.0 5.0 - 8.0   Glucose, UA NEGATIVE NEGATIVE mg/dL   Hgb urine dipstick SMALL (A) NEGATIVE   Bilirubin Urine NEGATIVE NEGATIVE   Ketones, ur NEGATIVE NEGATIVE mg/dL   Protein, ur NEGATIVE NEGATIVE mg/dL   Nitrite NEGATIVE NEGATIVE   Leukocytes,Ua SMALL (A) NEGATIVE   RBC / HPF 0-5 0 - 5 RBC/hpf   WBC, UA 0-5 0 - 5 WBC/hpf   Bacteria, UA RARE (A) NONE SEEN   Squamous Epithelial / HPF 0-5 0 - 5 /HPF  CBC with Differential     Status: None   Collection Time: 05/20/24   8:47 PM  Result Value Ref Range   WBC 7.8 4.0 - 10.5 K/uL   RBC 4.95 3.87 - 5.11 MIL/uL   Hemoglobin 14.5 12.0 - 15.0 g/dL   HCT 58.4 63.9 - 53.9 %   MCV 83.8 80.0 - 100.0 fL   MCH 29.3 26.0 - 34.0 pg   MCHC 34.9 30.0 - 36.0 g/dL   RDW 86.2 88.4 - 84.4 %   Platelets 252 150 - 400 K/uL   nRBC 0.0 0.0 - 0.2 %   Neutrophils Relative % 67 %   Neutro Abs 5.2 1.7 - 7.7 K/uL   Lymphocytes Relative 26 %   Lymphs Abs 2.0 0.7 - 4.0 K/uL   Monocytes Relative 6 %   Monocytes Absolute 0.5 0.1 - 1.0 K/uL   Eosinophils Relative 1 %   Eosinophils Absolute 0.0 0.0 - 0.5 K/uL   Basophils Relative 0 %   Basophils Absolute 0.0 0.0 - 0.1 K/uL   Immature Granulocytes 0 %   Abs Immature Granulocytes 0.02 0.00 - 0.07 K/uL  Comprehensive metabolic panel     Status: Abnormal   Collection Time: 05/20/24  8:47 PM  Result Value Ref Range   Sodium 136 135 - 145 mmol/L   Potassium 3.8 3.5 - 5.1 mmol/L   Chloride 108 98 - 111 mmol/L   CO2 20 (L) 22 - 32 mmol/L   Glucose, Bld 117 (H) 70 - 99 mg/dL   BUN 10 6 - 20 mg/dL   Creatinine, Ser 9.18 0.44 - 1.00 mg/dL   Calcium 9.3 8.9 - 89.6 mg/dL   Total Protein 6.4 (L) 6.5 - 8.1 g/dL   Albumin 3.8 3.5 - 5.0 g/dL   AST 17 15 - 41 U/L   ALT 17 0 - 44 U/L   Alkaline Phosphatase 45 38 - 126 U/L   Total Bilirubin 0.5 0.0 - 1.2 mg/dL   GFR, Estimated >39 >39 mL/min   Anion gap 8 5 - 15  POC Urine Pregnancy, ED (not at [facility] or [facility])     Status: Abnormal   Collection Time: 05/20/24  9:05 PM  Result Value Ref Range   Preg Test, Ur POSITIVE (A) NEGATIVE       IMAGING No results found.  MAU Management/MDM: I have reviewed the triage vital signs and the nursing notes.   Pertinent labs & imaging results that were available during my care of the patient were reviewed by me and considered in my medical decision making (see chart for details).      I have reviewed her medical records including past results, notes and treatments. Medical, Surgical, and family history  were reviewed.  Medications and recent lab tests were reviewed  Ordered usual first trimester r/o ectopic labs.   Pelvic exam and  cultures done Will check baseline Ultrasound to rule out ectopic.  Consult *** with presentation, exam findings, and results.   Treatments in MAU included ***.   This bleeding/pain can represent a normal pregnancy with bleeding, spontaneous abortion or even an ectopic which can be life-threatening.  The process as listed above helps to determine which of these is present.    ASSESSMENT 1. Pregnancy of unknown anatomic location   2. [redacted] weeks gestation of pregnancy   3. Bleeding in early pregnancy     PLAN Discharge home Plan to repeat HCG level in 48 hours in clinic per 11:00 am schedule Will repeat  Ultrasound in about 7-10 days if HCG levels double appropriately  Ectopic precautions   Pt stable at time of discharge. Encouraged to return here if she develops worsening of symptoms, increase in pain, fever, or other concerning symptoms.    Earnie Pouch CNM, MSN Certified Nurse-Midwife 05/20/2024  9:44 PM

## 2024-05-20 NOTE — ED Provider Triage Note (Signed)
 Emergency Medicine Provider Triage Evaluation Note   History obtained through Spanish interpreter Christine Roach , a 43 y.o. female  was evaluated in triage.  Pt complains of vaginal bleeding that started yesterday.  Reports she is about [redacted] weeks pregnant.  Also reports some mild right lower quadrant pain that started today.  Denies any passage of clots.  Denies any fevers.  Review of Systems  Positive: As above Negative: As above  Physical Exam  BP 123/78 (BP Location: Right Arm)   Pulse 81   Temp 98 F (36.7 C)   Resp 20   Ht 5' 5 (1.651 m)   Wt 85.7 kg   LMP 04/05/2024   SpO2 97%   BMI 31.44 kg/m  Gen:   Awake, no distress   Resp:  Normal effort  MSK:   Moves extremities without difficulty   Medical Decision Making  Medically screening exam initiated at 8:40 PM.  Appropriate orders placed.  Christine Roach was informed that the remainder of the evaluation will be completed by another provider, this initial triage assessment does not replace that evaluation, and the importance of remaining in the ED until their evaluation is complete.     Christine Palma, PA-C 05/20/24 2040

## 2024-05-20 NOTE — MAU Note (Signed)
..  Christine Roach is a 43 y.o. at [redacted]w[redacted]d here in MAU reporting: brown spotting when she wipes, began tonight. Reports an intermittent poking pain in lower right abdomen.   Pain score: 2/10 Vitals:   05/20/24 2016 05/20/24 2301  BP: 123/78 124/72  Pulse: 81 68  Resp: 20 20  Temp: 98 F (36.7 C) 98 F (36.7 C)  SpO2: 97% 100%     FHT:n/a Lab orders placed from triage:  none

## 2024-05-21 ENCOUNTER — Telehealth: Payer: Self-pay

## 2024-05-21 DIAGNOSIS — Z3A01 Less than 8 weeks gestation of pregnancy: Secondary | ICD-10-CM

## 2024-05-21 DIAGNOSIS — R102 Pelvic and perineal pain: Secondary | ICD-10-CM

## 2024-05-21 DIAGNOSIS — O209 Hemorrhage in early pregnancy, unspecified: Secondary | ICD-10-CM

## 2024-05-21 DIAGNOSIS — O26891 Other specified pregnancy related conditions, first trimester: Secondary | ICD-10-CM

## 2024-05-21 DIAGNOSIS — O3680X0 Pregnancy with inconclusive fetal viability, not applicable or unspecified: Secondary | ICD-10-CM

## 2024-05-21 LAB — GC/CHLAMYDIA PROBE AMP (~~LOC~~) NOT AT ARMC
Chlamydia: NEGATIVE
Comment: NEGATIVE
Comment: NORMAL
Neisseria Gonorrhea: NEGATIVE

## 2024-05-21 NOTE — Telephone Encounter (Signed)
 Called pt via interpreter services to notify of NOB appt. Left vm for pt to call office back.

## 2024-06-01 NOTE — Telephone Encounter (Signed)
 I spoke with patient via Spanish interpreter ID [phone] and scheduled her ultrasound an genetic counseling appointment. She was given the address/phone number to the [facility] location.

## 2024-06-17 ENCOUNTER — Encounter: Payer: Self-pay | Admitting: Obstetrics & Gynecology

## 2024-06-26 ENCOUNTER — Encounter: Payer: Self-pay | Admitting: Family Medicine

## 2024-06-26 ENCOUNTER — Telehealth: Payer: Self-pay

## 2024-06-26 ENCOUNTER — Encounter (HOSPITAL_COMMUNITY): Payer: Self-pay | Admitting: Obstetrics and Gynecology

## 2024-06-26 ENCOUNTER — Ambulatory Visit (INDEPENDENT_AMBULATORY_CARE_PROVIDER_SITE_OTHER): Payer: Self-pay | Admitting: Family Medicine

## 2024-06-26 ENCOUNTER — Other Ambulatory Visit: Payer: Self-pay

## 2024-06-26 VITALS — BP 121/78 | HR 74 | Wt 206.0 lb

## 2024-06-26 DIAGNOSIS — Z3A01 Less than 8 weeks gestation of pregnancy: Secondary | ICD-10-CM

## 2024-06-26 DIAGNOSIS — Z3A11 11 weeks gestation of pregnancy: Secondary | ICD-10-CM

## 2024-06-26 DIAGNOSIS — O0991 Supervision of high risk pregnancy, unspecified, first trimester: Secondary | ICD-10-CM

## 2024-06-26 DIAGNOSIS — O021 Missed abortion: Secondary | ICD-10-CM

## 2024-06-26 DIAGNOSIS — O0990 Supervision of high risk pregnancy, unspecified, unspecified trimester: Secondary | ICD-10-CM

## 2024-06-26 NOTE — Telephone Encounter (Signed)
 I called patient to provide surgery details. Patient is scheduled at [facility] on 06/29/24 at 1 pm. Patient is aware she must arrive by 11 am. Pre-op instructions and surgery details were provided by phone.

## 2024-06-26 NOTE — Progress Notes (Signed)
  Amenorrhea with positive UPT PROBLEM  VISIT ENCOUNTER NOTE  Subjective:   Christine Roach is a 43 y.o. [phone]  female here for positive pregnancy test.  She has not had serial bhcg. She has an US . Her last US  was 7/30 -- showed viable IUP.  US  today performed for new OB visit US  showed 11 wk fetus without fetal cardiac activity, no movement seen  Denies abnormal vaginal bleeding, discharge, pelvic pain, problems with intercourse or other gynecologic concerns.    Gynecologic History Patient's last menstrual period was 04/05/2024.  Health Maintenance Due  Topic Date Due   HIV Screening  Never done   Hepatitis C Screening  Never done   Pneumococcal Vaccine (1 of 2 - PCV) Never done   Hepatitis B Vaccines 19-59 Average Risk (1 of 3 - 19+ 3-dose series) Never done   HPV VACCINES (1 - 3-dose SCDM series) Never done   Cervical Cancer Screening (HPV/Pap Cotest)  10/04/2021   Influenza Vaccine  05/22/2024   COVID-19 Vaccine (4 - 2025-26 season) 06/22/2024    The following portions of the patient's history were reviewed and updated as appropriate: allergies, current medications, past family history, past medical history, past social history, past surgical history and problem list.  Review of Systems Pertinent items are noted in HPI.   Objective:  BP 121/78   Pulse 74   Wt 206 lb (93.4 kg)   LMP 04/05/2024   BMI 34.28 kg/m  Gen: well appearing, NAD HEENT: no scleral icterus CV: RR Lung: Normal WOB Ext: warm well perfused   Assessment and Plan:  1. Supervision of high risk pregnancy, antepartum - US  OB Limited; Future  2. Missed abortion (Primary) Reviewed options  1) Expectant Management 2) Cytotec  (Misoprostol ) 3) Dilation and curettage: Reviewed this as best option given GA  Provided support Discussed r/b of each option Reviewed in detail reasons to go to the hospital  Reviewed procedure and process of D&E scheduling at the hospital  Visit was conducted  with in person interpreter services.  - Ambulatory Referral For Surgery Scheduling  Face to face time:   Greater than 50% of the visit time was spent in counseling and coordination of care with the patient.  The summary and outline of the counseling and care coordination is summarized in the note above.   All questions were answered.   Please refer to After Visit Summary for other counseling recommendations.   Return go to the hospital for heavy bleeding, severe abdominal pain, fever, chills.  Suzen Maryan Masters, MD, MPH, ABFM Attending Physician Faculty Practice- Center for [facility]

## 2024-06-26 NOTE — Progress Notes (Signed)
 SDW CALL  Interpreter used for SDW call.  Interpreter requested for day of surgery.   Patient was given pre-op instructions over the phone. The opportunity was given for the patient to ask questions. No further questions asked. Patient verbalized understanding of instructions given.   PCP - Dr. Adrianna Crooked Cardiologist - denies  PPM/ICD - denies   Chest x-ray - denies EKG - DOS Stress Test - denies ECHO - denies Cardiac Cath - denies  Sleep Study - denies - but has been referred for a sleep study   Fasting Blood Sugar -  107-114 Checks Blood Sugar __1___ times a day  Patient instructed to check blood sugar on the morning of surgery every 2 hours and if blood sugar were to be less than 70, patient is to drink 1/2 cup of apple or cranberry juice.  Last dose of GLP1 agonist-  n/a GLP1 instructions:  n/a  Blood Thinner Instructions: n/a Aspirin Instructions: n/a  ERAS Protcol - clears until 1015 PRE-SURGERY Ensure or G2- n/a  COVID TEST- n/a   Anesthesia review: no  Patient denies shortness of breath, fever, cough and chest pain over the phone call   All instructions explained to the patient, with a verbal understanding of the material. Patient agrees to go over the instructions while at home for a better understanding.

## 2024-06-26 NOTE — Progress Notes (Signed)

## 2024-06-26 NOTE — Patient Instructions (Addendum)
 There are three options for managing a miscarriage: 1) Expectant Management: This is where you wait for 2 weeks to see if the body passes the pregnancy on its own-- this has a risk of bleeding higher than the other options 2) Cytotec  (Misoprostol ): this is a medication that causes cramping of the uterus and can help passage of the pregnancy. This is not likely to work for you because your pregnancy is 11 weeks.  3) Dilation and curettage: this is a surgical procedure to remove the pregnancy from the uterus-- This is the recommended option given the age of your pregnancy.   You have decided to do  DILATION&CURETTAGE  You will be contacted by our scheduler to let you know the date and the time of your procedure  You need to call or go to the emergency room for: -Bleeding that fills up 1 pad per hour -Severe abdominal pain -Dizziness/lightheadedness -Passing out Or any medical concern

## 2024-06-26 NOTE — Progress Notes (Signed)
 NOB   Pt denies any hx of HSV. (Rx for Valtrex  was in pt chart.)  U/S : 05/20/2024 @ 7wks  Last pap: 10/04/16 WNL GC/CT on 05/20/24 Negative   CC: Nausea notes vaginal discharge

## 2024-06-29 ENCOUNTER — Ambulatory Visit (HOSPITAL_COMMUNITY): Admission: RE | Admit: 2024-06-29 | Payer: Self-pay | Source: Home / Self Care | Admitting: Obstetrics and Gynecology

## 2024-06-29 HISTORY — DX: Anemia, unspecified: D64.9

## 2024-06-29 HISTORY — DX: Type 2 diabetes mellitus without complications: E11.9

## 2024-06-29 SURGERY — DILATION AND EVACUATION, UTERUS
Anesthesia: Choice

## 2024-07-02 ENCOUNTER — Other Ambulatory Visit: Payer: Self-pay | Admitting: Family Medicine

## 2024-07-02 ENCOUNTER — Other Ambulatory Visit: Payer: Self-pay

## 2024-07-02 ENCOUNTER — Encounter (HOSPITAL_COMMUNITY): Payer: Self-pay | Admitting: Family Medicine

## 2024-07-02 ENCOUNTER — Telehealth: Payer: Self-pay | Admitting: Family Medicine

## 2024-07-02 DIAGNOSIS — I10 Essential (primary) hypertension: Secondary | ICD-10-CM | POA: Diagnosis present

## 2024-07-02 DIAGNOSIS — O021 Missed abortion: Secondary | ICD-10-CM

## 2024-07-02 DIAGNOSIS — E119 Type 2 diabetes mellitus without complications: Secondary | ICD-10-CM

## 2024-07-02 MED ORDER — DOXYCYCLINE HYCLATE 100 MG IV SOLR: 200.0000 mg | INTRAVENOUS | Status: DC

## 2024-07-02 NOTE — Progress Notes (Signed)
 SDW call  Patient was given pre-op instructions over the phone utilizing [facility] interpreter # [phone]. Patient verbalized understanding of instructions provided.     PCP - [facility] Cardiologist -  Pulmonary:    PPM/ICD - denies Device Orders - na Rep Notified - na   Chest x-ray - na EKG -  DOS, 07/03/2024 Stress Test - ECHO -  Cardiac Cath -   Sleep Study/sleep apnea/CPAP: denies  Type II diabetes, A1C 5.6 on 05/12/2024 Fasting Blood sugar range: 107-115 How often check sugars: daily Metformin, hold DOS  Blood Thinner Instructions: denies Aspirin Instructions:denies   ERAS Protcol - NPO   Anesthesia review: No   Patient denies shortness of breath, fever, cough and chest pain over the phone call  Your procedure is scheduled on Friday July 03, 2024  Report to [facility] Entrance A at  1130  A.M., then check in with the Admitting office.  Call this number if you have problems the morning of surgery:  [phone]   If you have any questions prior to your surgery date call [phone]: Open Monday-Friday 8am-4pm If you experience any cold or flu symptoms such as cough, fever, chills, shortness of breath, etc. between now and your scheduled surgery, please notify us  at the above number     Remember:  Do not eat or drink after midnight the night before your surgery  Take these medicines the morning of surgery with A SIP OF WATER:  None  As of today, STOP taking any Aspirin (unless otherwise instructed by your surgeon) Aleve , Naproxen , Ibuprofen , Motrin , Advil , Goody's, BC's, all herbal medications, fish oil, and all vitamins.

## 2024-07-02 NOTE — Telephone Encounter (Signed)
 Called with [facility] interpreter [place] ID#  [phone]  Last night Having some contractions and some bleeding yesterday.   Primary care doctor= [facility]-  located on [address].   She had elevated blood pressure and diabetes- PCP recommended waiting 1-2 weeks   Discussed possibility D&E tomorrow. She agrees to this today.   Reviewed to remain NPO for 8 hours  Asked about genetic testing for fetus. She does desire this  I have sent a chat to the OR scheduler Dejuana Wright Suzen Maryan Eldonna, MD, MPH, ABFM, [facility] Attending Physician Center for [facility]

## 2024-07-02 NOTE — H&P (Signed)
 Preoperative History and Physical  Christine Roach is a 43 y.o. [id number] here for surgical management of failed pregnancy at 11 weeks necessitating D&E.   No significant preoperative concerns.  Patient with a history of T2DM and HTN. CSx1 and prior DE  Proposed surgery: Dilation and Evacuation under ultrasound guidance  Past Medical History:  Diagnosis Date   Anemia    Diabetes mellitus without complication (HCC)    Past Surgical History:  Procedure Laterality Date   CESAREAN SECTION     CHOLECYSTECTOMY  08/25/2012   Procedure: LAPAROSCOPIC CHOLECYSTECTOMY WITH INTRAOPERATIVE CHOLANGIOGRAM;  Surgeon: Elspeth KYM Schultze, MD;  Location: [facility] OR;  Service: General;  Laterality: N/A;   OB History  Gravida Para Term Preterm AB Living  6 3 2 1 2 3   SAB IAB Ectopic Multiple Live Births  2        # Outcome Date GA Lbr Len/2nd Weight Sex Type Anes PTL Lv  6 Current           5 SAB 06/2012             Birth Comments: System Generated. Please review and update pregnancy details.  4 SAB           3 Preterm  [redacted]w[redacted]d    CS-Unspec     2 Term      Vag-Spont     1 Term      Vag-Spont     Patient denies any other pertinent gynecologic issues.   No current facility-administered medications on file prior to encounter.   Current Outpatient Medications on File Prior to Encounter  Medication Sig Dispense Refill   metFORMIN (GLUCOPHAGE) 1000 MG tablet Take 1,000 mg by mouth 2 (two) times daily with a meal.     Prenatal 28-0.8 MG TABS Take 1 tablet by mouth daily.     No Known Allergies  Social History:   reports that she has never smoked. She has never used smokeless tobacco. She reports that she does not drink alcohol and does not use drugs.  Family History  Problem Relation Age of Onset   Hypertension Mother    Diabetes Mother    Cancer Paternal Grandmother        liver    Review of Systems: Noncontributory  PHYSICAL EXAM: Height 5' 6 (1.676 m), weight 93.4 kg, last menstrual period  04/05/2024. CONSTITUTIONAL: Well-developed, well-nourished female in no acute distress.  HENT:  Normocephalic, atraumatic, External right and left ear normal. Oropharynx is clear and moist EYES: Conjunctivae and EOM are normal. Pupils are equal, round, and reactive to light. No scleral icterus.  NECK: Normal range of motion, supple, no masses SKIN: Skin is warm and dry. No rash noted. Not diaphoretic. No erythema. No pallor. NEUROLGIC: Alert and oriented to person, place, and time. Normal reflexes, muscle tone coordination. No cranial nerve deficit noted. PSYCHIATRIC: Normal mood and affect. Normal behavior. Normal judgment and thought content. CARDIOVASCULAR: Normal heart rate noted, regular rhythm RESPIRATORY: Effort and breath sounds normal, no problems with respiration noted ABDOMEN: Soft, nontender, nondistended. PELVIC: Deferred MUSCULOSKELETAL: Normal range of motion. No edema and no tenderness. 2+ distal pulses.  Labs: No results found for this or any previous visit (from the past 2 weeks).  Imaging Studies: US  OB Limited Result Date: 06/26/2024 ----------------------------------------------------------------------  OBSTETRICS REPORT                       (Signed Final 06/26/2024 02:35 pm) ---------------------------------------------------------------------- Patient Info  ID #:  [id number]                          D.O.B.:  October 31, 1980 (43 yrs)(F)  Name:       Christine ALAS-                  Visit Date: 06/26/2024 02:22 pm              Roach ---------------------------------------------------------------------- Performed By  Attending:        Suzen Masters        Ref. Address:     [address]                    [address]                                                             [address]  Performed By:     Wanda Buckles RN     Location:         Center for                                                             [facility]  Referred By:      [facility] Correne Beagle ---------------------------------------------------------------------- Orders  #  Description                           Code        Ordered By  1  US  OB LIMITED                         23184.9     SUZEN MASTERS ----------------------------------------------------------------------  #  Order #                     Accession #                Episode #  1  [id number]                   [id number]                 [id number] ---------------------------------------------------------------------- Indications  Unable to hear fetal heart tones as reason     O76  for ultrasound  [redacted] weeks gestation of pregnancy  Z3A.12 ---------------------------------------------------------------------- Fetal Evaluation  Num Of Fetuses:         1  Preg. Location:         Intrauterine  Gest. Sac:              Intrauterine  Yolk Sac:               Previously seen  Fetal Pole:             Visualized  Cardiac Activity:       Absent ---------------------------------------------------------------------- Biometry  CRL:      48.2  mm     G. Age:  72w 3d                  EDD:   01/12/25 ---------------------------------------------------------------------- OB History  Gravidity:    6         Term:   2        Prem:   1        SAB:   2  TOP:          0       Ectopic:  0        Living: 3 ---------------------------------------------------------------------- Gestational Age  LMP:           11w 5d        Date:  04/05/24                  EDD:   01/10/25  Best:          12w 2d     Det. By:  Early Ultrasound         EDD:   01/06/25                                      (05/20/24) ---------------------------------------------------------------------- Impression  Previously seen cardiac activity is no longer present  Definitive for failed pregnancy/fetal demise, 11 wk fetus  ---------------------------------------------------------------------- Recommendations  Patient seen same day for discussion  Desires D&E, sent message to OR scheduler ----------------------------------------------------------------------              Suzen Masters, MD Electronically Signed Final Report   06/26/2024 02:35 pm ----------------------------------------------------------------------    Assessment: Patient Active Problem List   Diagnosis Date Noted   Type 2 diabetes mellitus (HCC) 07-13-24   Chronic hypertension 2024/07/13   Fetal demise before 20 weeks with retention of dead fetus 2024-07-13   S/P cholecystectomy 10/04/2016   Irregular menses 01/21/2015    Plan: Patient will undergo surgical management with D&E.   The risks of surgery were discussed in detail with the patient including but not limited to: bleeding which may require transfusion or reoperation; infection which may require antibiotics; injury to surrounding organs which may involve bowel, bladder, ureters ; need for additional procedures including laparoscopy or laparotomy; thromboembolic phenomenon, surgical site problems and other postoperative/anesthesia complications. Likelihood of success in alleviating the patient's condition was discussed. Routine postoperative instructions will be reviewed with the patient and her family in detail after surgery.  The patient concurred with the proposed plan, giving informed written consent for the surgery.  Patient has been NPO since last night she will remain NPO for procedure.  Anesthesia and OR aware.  Preoperative prophylactic antibiotics and SCDs ordered on call to the OR.  To OR when ready.  Suzen Maryan Masters, MD, MPH, ABFM, [facility] Attending Physician Center for [facility]  Care  07/02/2024 2:48 PM

## 2024-07-03 ENCOUNTER — Ambulatory Visit (HOSPITAL_COMMUNITY): Payer: Self-pay

## 2024-07-03 ENCOUNTER — Ambulatory Visit (HOSPITAL_COMMUNITY): Payer: Self-pay | Admitting: Anesthesiology

## 2024-07-03 ENCOUNTER — Encounter (HOSPITAL_COMMUNITY)
Admission: RE | Disposition: A | Discharge status: Home / Self Care | Payer: Self-pay | Source: Home / Self Care | Attending: Family Medicine

## 2024-07-03 ENCOUNTER — Encounter (HOSPITAL_COMMUNITY): Payer: Self-pay | Admitting: Anesthesiology

## 2024-07-03 ENCOUNTER — Other Ambulatory Visit: Payer: Self-pay

## 2024-07-03 ENCOUNTER — Encounter (HOSPITAL_COMMUNITY): Payer: Self-pay | Admitting: Family Medicine

## 2024-07-03 ENCOUNTER — Ambulatory Visit (HOSPITAL_COMMUNITY)
Admission: RE | Admit: 2024-07-03 | Discharge: 2024-07-03 | Disposition: A | Discharge status: Home / Self Care | Payer: Self-pay | Attending: Family Medicine | Admitting: Family Medicine

## 2024-07-03 DIAGNOSIS — Z8249 Family history of ischemic heart disease and other diseases of the circulatory system: Secondary | ICD-10-CM | POA: Insufficient documentation

## 2024-07-03 DIAGNOSIS — Z7984 Long term (current) use of oral hypoglycemic drugs: Secondary | ICD-10-CM | POA: Insufficient documentation

## 2024-07-03 DIAGNOSIS — O021 Missed abortion: Secondary | ICD-10-CM

## 2024-07-03 DIAGNOSIS — E119 Type 2 diabetes mellitus without complications: Secondary | ICD-10-CM | POA: Insufficient documentation

## 2024-07-03 DIAGNOSIS — N96 Recurrent pregnancy loss: Secondary | ICD-10-CM

## 2024-07-03 DIAGNOSIS — Z833 Family history of diabetes mellitus: Secondary | ICD-10-CM | POA: Insufficient documentation

## 2024-07-03 DIAGNOSIS — Z3A13 13 weeks gestation of pregnancy: Secondary | ICD-10-CM

## 2024-07-03 DIAGNOSIS — I10 Essential (primary) hypertension: Secondary | ICD-10-CM | POA: Insufficient documentation

## 2024-07-03 HISTORY — PX: DILATION AND EVACUATION: SHX1459

## 2024-07-03 HISTORY — PX: OPERATIVE ULTRASOUND: SHX5996

## 2024-07-03 LAB — GLUCOSE, CAPILLARY
Glucose-Capillary: 110 mg/dL — ABNORMAL HIGH (ref 70–99)
Glucose-Capillary: 119 mg/dL — ABNORMAL HIGH (ref 70–99)

## 2024-07-03 LAB — COMPREHENSIVE METABOLIC PANEL WITH GFR
ALT: 14 U/L (ref 0–44)
AST: 15 U/L (ref 15–41)
Albumin: 3.6 g/dL (ref 3.5–5.0)
Alkaline Phosphatase: 43 U/L (ref 38–126)
Anion gap: 10 (ref 5–15)
BUN: 5 mg/dL — ABNORMAL LOW (ref 6–20)
CO2: 18 mmol/L — ABNORMAL LOW (ref 22–32)
Calcium: 8.7 mg/dL — ABNORMAL LOW (ref 8.9–10.3)
Chloride: 107 mmol/L (ref 98–111)
Creatinine, Ser: 0.48 mg/dL (ref 0.44–1.00)
GFR, Estimated: >60 mL/min (ref >60)
Glucose, Bld: 95 mg/dL (ref 70–99)
Potassium: 3.4 mmol/L — ABNORMAL LOW (ref 3.5–5.1)
Sodium: 135 mmol/L (ref 135–145)
Total Bilirubin: 0.8 mg/dL (ref 0.0–1.2)
Total Protein: 6.5 g/dL (ref 6.5–8.1)

## 2024-07-03 LAB — TYPE AND SCREEN
ABO/RH(D): O POS
Antibody Screen: NEGATIVE

## 2024-07-03 LAB — CBC
HCT: 42 % (ref 36.0–46.0)
Hemoglobin: 14.4 g/dL (ref 12.0–15.0)
MCH: 29.1 pg (ref 26.0–34.0)
MCHC: 34.3 g/dL (ref 30.0–36.0)
MCV: 84.8 fL (ref 80.0–100.0)
Platelets: 238 K/uL (ref 150–400)
RBC: 4.95 MIL/uL (ref 3.87–5.11)
RDW: 13.6 % (ref 11.5–15.5)
WBC: 8.9 K/uL (ref 4.0–10.5)
nRBC: 0 % (ref 0.0–0.2)

## 2024-07-03 LAB — HIV ANTIBODY (ROUTINE TESTING W REFLEX): HIV Screen 4th Generation wRfx: NONREACTIVE

## 2024-07-03 SURGERY — DILATION AND EVACUATION, UTERUS
Anesthesia: General | Site: Vagina

## 2024-07-03 MED ORDER — DOXYCYCLINE HYCLATE 100 MG IV SOLR
200.0000 mg | INTRAVENOUS | Status: AC
  Administered 2024-07-03: 200 mg via INTRAVENOUS
  Filled 2024-07-03: qty 200

## 2024-07-03 MED ORDER — FENTANYL CITRATE (PF) 250 MCG/5ML IJ SOLN
INTRAMUSCULAR | Status: AC
  Filled 2024-07-03: qty 5

## 2024-07-03 MED ORDER — ONDANSETRON HCL 4 MG/2ML IJ SOLN
INTRAMUSCULAR | Status: AC
  Filled 2024-07-03: qty 2

## 2024-07-03 MED ORDER — LACTATED RINGERS IV SOLN
INTRAVENOUS | Status: DC
Start: 2024-07-03 — End: 2024-07-03

## 2024-07-03 MED ORDER — LIDOCAINE 2% (20 MG/ML) 5 ML SYRINGE
INTRAMUSCULAR | Status: AC
  Filled 2024-07-03: qty 5

## 2024-07-03 MED ORDER — OXYCODONE HCL 5 MG PO CAPS: 5.0000 mg | ORAL_CAPSULE | ORAL | 0 refills | Status: AC | PRN

## 2024-07-03 MED ORDER — PROPOFOL 10 MG/ML IV BOLUS
INTRAVENOUS | Status: AC
  Filled 2024-07-03: qty 20

## 2024-07-03 MED ORDER — ORAL CARE MOUTH RINSE: 15.0000 mL | Freq: Once | OROMUCOSAL | Status: AC

## 2024-07-03 MED ORDER — PROPOFOL 10 MG/ML IV BOLUS
INTRAVENOUS | Status: DC | PRN
Start: 2024-07-03 — End: 2024-07-03
  Administered 2024-07-03: 200 mg via INTRAVENOUS

## 2024-07-03 MED ORDER — METHYLERGONOVINE MALEATE 0.2 MG/ML IJ SOLN
INTRAMUSCULAR | Status: AC
  Filled 2024-07-03: qty 1

## 2024-07-03 MED ORDER — ACETAMINOPHEN 500 MG PO TABS
1000.0000 mg | ORAL_TABLET | Freq: Once | ORAL | Status: AC
  Administered 2024-07-03: 1000 mg via ORAL
  Filled 2024-07-03: qty 2

## 2024-07-03 MED ORDER — TRANEXAMIC ACID-NACL 1000-0.7 MG/100ML-% IV SOLN
1000.0000 mg | Freq: Once | INTRAVENOUS | Status: AC
  Administered 2024-07-03: 1000 mg via INTRAVENOUS
  Filled 2024-07-03: qty 100

## 2024-07-03 MED ORDER — DEXMEDETOMIDINE HCL IN NACL 80 MCG/20ML IV SOLN
INTRAVENOUS | Status: DC | PRN
  Administered 2024-07-03: 8 ug via INTRAVENOUS

## 2024-07-03 MED ORDER — METHYLERGONOVINE MALEATE 0.2 MG/ML IJ SOLN
INTRAMUSCULAR | Status: DC | PRN
  Administered 2024-07-03: .2 mg via INTRAVENOUS

## 2024-07-03 MED ORDER — DOCUSATE SODIUM 100 MG PO CAPS: 100.0000 mg | ORAL_CAPSULE | Freq: Two times a day (BID) | ORAL | 2 refills | Status: AC | PRN

## 2024-07-03 MED ORDER — MIDAZOLAM HCL 2 MG/2ML IJ SOLN
INTRAMUSCULAR | Status: AC
Start: 2024-07-03 — End: 2024-07-03
  Filled 2024-07-03: qty 2

## 2024-07-03 MED ORDER — LIDOCAINE HCL 1 % IJ SOLN
INTRAMUSCULAR | Status: DC | PRN
  Administered 2024-07-03: 20 mL

## 2024-07-03 MED ORDER — PHENYLEPHRINE HCL-NACL 20-0.9 MG/250ML-% IV SOLN
INTRAVENOUS | Status: DC | PRN
  Administered 2024-07-03 (×3): 80 ug via INTRAVENOUS

## 2024-07-03 MED ORDER — DEXAMETHASONE SODIUM PHOSPHATE 10 MG/ML IJ SOLN
INTRAMUSCULAR | Status: AC
Start: 2024-07-03 — End: 2024-07-03
  Filled 2024-07-03: qty 1

## 2024-07-03 MED ORDER — IBUPROFEN 800 MG PO TABS: 800.0000 mg | ORAL_TABLET | Freq: Three times a day (TID) | ORAL | 1 refills | Status: AC | PRN

## 2024-07-03 MED ORDER — POVIDONE-IODINE 10 % EX SWAB: 2.0000 | Freq: Once | CUTANEOUS | Status: DC

## 2024-07-03 MED ORDER — DEXAMETHASONE SODIUM PHOSPHATE 10 MG/ML IJ SOLN
INTRAMUSCULAR | Status: DC | PRN
  Administered 2024-07-03: 10 mg via INTRAVENOUS

## 2024-07-03 MED ORDER — CHLORHEXIDINE GLUCONATE 0.12 % MT SOLN
15.0000 mL | Freq: Once | OROMUCOSAL | Status: AC
  Administered 2024-07-03: 15 mL via OROMUCOSAL
  Filled 2024-07-03: qty 15

## 2024-07-03 MED ORDER — TRANEXAMIC ACID-NACL 1000-0.7 MG/100ML-% IV SOLN
INTRAVENOUS | Status: AC
  Filled 2024-07-03: qty 100

## 2024-07-03 MED ORDER — KETOROLAC TROMETHAMINE 30 MG/ML IJ SOLN
INTRAMUSCULAR | Status: AC
  Filled 2024-07-03: qty 1

## 2024-07-03 MED ORDER — DEXAMETHASONE SODIUM PHOSPHATE 10 MG/ML IJ SOLN
INTRAMUSCULAR | Status: AC
  Filled 2024-07-03: qty 1

## 2024-07-03 MED ORDER — ACETAMINOPHEN 500 MG PO TABS: 1000.0000 mg | ORAL_TABLET | ORAL | Status: DC

## 2024-07-03 MED ORDER — MIDAZOLAM HCL 2 MG/2ML IJ SOLN
INTRAMUSCULAR | Status: DC | PRN
  Administered 2024-07-03: 2 mg via INTRAVENOUS

## 2024-07-03 MED ORDER — ONDANSETRON HCL 4 MG/2ML IJ SOLN
INTRAMUSCULAR | Status: DC | PRN
  Administered 2024-07-03: 4 mg via INTRAVENOUS

## 2024-07-03 MED ORDER — INSULIN ASPART 100 UNIT/ML IJ SOLN: 0.0000 [IU] | INTRAMUSCULAR | Status: DC | PRN

## 2024-07-03 MED ORDER — LIDOCAINE 2% (20 MG/ML) 5 ML SYRINGE
INTRAMUSCULAR | Status: DC | PRN
  Administered 2024-07-03: 100 mg via INTRAVENOUS

## 2024-07-03 MED ORDER — FENTANYL CITRATE (PF) 250 MCG/5ML IJ SOLN
INTRAMUSCULAR | Status: DC | PRN
  Administered 2024-07-03: 50 ug via INTRAVENOUS

## 2024-07-03 MED ORDER — LACTATED RINGERS IV SOLN: INTRAVENOUS | Status: DC

## 2024-07-03 SURGICAL SUPPLY — 21 items
CATH ROBINSON RED A/P 16FR (CATHETERS) ×3 IMPLANT
COVER MAYO STAND STRL (DRAPES) ×3 IMPLANT
FILTER UTR ASPR ASSEMBLY (MISCELLANEOUS) ×3 IMPLANT
GLOVE BIO SURGEON STRL SZ8 (GLOVE) ×3 IMPLANT
GLOVE BIOGEL PI IND STRL 7.0 (GLOVE) ×3 IMPLANT
GOWN STRL REUS W/ TWL LRG LVL3 (GOWN DISPOSABLE) ×6 IMPLANT
HOSE CONNECTING 18IN BERKELEY (TUBING) ×3 IMPLANT
KIT BERKELEY 1ST TRI 3/8 NO TR (MISCELLANEOUS) ×3 IMPLANT
KIT BERKELEY 1ST TRIMESTER 3/8 (MISCELLANEOUS) ×3 IMPLANT
NS IRRIG 1000ML POUR BTL (IV SOLUTION) ×3 IMPLANT
PACK VAGINAL MINOR WOMEN LF (CUSTOM PROCEDURE TRAY) ×3 IMPLANT
PAD OB MATERNITY 11 LF (PERSONAL CARE ITEMS) ×3 IMPLANT
SET BERKELEY SUCTION TUBING (SUCTIONS) ×3 IMPLANT
SPIKE FLUID TRANSFER (MISCELLANEOUS) ×3 IMPLANT
SPONGE T-LAP 18X18 ~~LOC~~+RFID (SPONGE) IMPLANT
TOWEL GREEN STERILE FF (TOWEL DISPOSABLE) ×6 IMPLANT
UNDERPAD 30X36 HEAVY ABSORB (UNDERPADS AND DIAPERS) ×3 IMPLANT
VACURETTE 10 RIGID CVD (CANNULA) IMPLANT
VACURETTE 7MM CVD STRL WRAP (CANNULA) IMPLANT
VACURETTE 8 RIGID CVD (CANNULA) IMPLANT
VACURETTE 9 RIGID CVD (CANNULA) IMPLANT

## 2024-07-03 NOTE — Transfer of Care (Signed)
 Immediate Anesthesia Transfer of Care Note  Patient: Christine Roach  Procedure(s) Performed: DILATION AND EVACUATION, UTERUS (Vagina ) US  INTRAOPERATIVE (Abdomen)  Patient Location: PACU  Anesthesia Type:General  Level of Consciousness: drowsy and patient cooperative  Airway & Oxygen Therapy: Patient Spontanous Breathing and Patient connected to face mask oxygen  Post-op Assessment: Report given to RN, Post -op Vital signs reviewed and stable, Patient moving all extremities, and Patient moving all extremities X 4  Post vital signs: Reviewed and stable  Last Vitals:  Vitals Value Taken Time  BP 107/70 07/03/24 15:25  Temp    Pulse 84 07/03/24 15:28  Resp 18 07/03/24 15:28  SpO2 100 % 07/03/24 15:28  Vitals shown include unfiled device data.  Last Pain:  Vitals:   07/03/24 1301  TempSrc:   PainSc: 2          Complications: No notable events documented.

## 2024-07-03 NOTE — Anesthesia Procedure Notes (Signed)
 Procedure Name: LMA Insertion Date/Time: 07/03/2024 2:23 PM  Performed by: Vanice Search, RNPre-anesthesia Checklist: Patient identified, Emergency Drugs available, Suction available and Patient being monitored Patient Re-evaluated:Patient Re-evaluated prior to induction Oxygen Delivery Method: Circle System Utilized Preoxygenation: Pre-oxygenation with 100% oxygen Induction Type: IV induction Ventilation: Mask ventilation without difficulty LMA: LMA inserted LMA Size: 4.0 and 3.0 Number of attempts: 1 Airway Equipment and Method: Bite block Placement Confirmation: positive ETCO2 Tube secured with: Tape Dental Injury: Teeth and Oropharynx as per pre-operative assessment

## 2024-07-03 NOTE — Anesthesia Preprocedure Evaluation (Addendum)
 Anesthesia Evaluation  Patient identified by MRN, date of birth, ID band Patient awake    Reviewed: Allergy & Precautions, NPO status , Patient's Chart, lab work & pertinent test results  Airway Mallampati: III  TM Distance: >3 FB Neck ROM: Full    Dental  (+) Dental Advisory Given   Pulmonary neg pulmonary ROS   Pulmonary exam normal breath sounds clear to auscultation       Cardiovascular hypertension,  Rhythm:Regular Rate:Normal     Neuro/Psych negative neurological ROS     GI/Hepatic negative GI ROS, Neg liver ROS,,,  Endo/Other  diabetes    Renal/GU negative Renal ROS     Musculoskeletal negative musculoskeletal ROS (+)    Abdominal  (+) + obese  Peds  Hematology  (+) Blood dyscrasia, anemia   Anesthesia Other Findings   Reproductive/Obstetrics                              Anesthesia Physical Anesthesia Plan  ASA: 2  Anesthesia Plan: General   Post-op Pain Management: Tylenol  PO (pre-op)* and Toradol  IV (intra-op)*   Induction: Intravenous  PONV Risk Score and Plan: 4 or greater and Ondansetron , Dexamethasone , Midazolam  and Treatment may vary due to age or medical condition  Airway Management Planned: LMA  Additional Equipment:   Intra-op Plan:   Post-operative Plan: Extubation in OR  Informed Consent: I have reviewed the patients History and Physical, chart, labs and discussed the procedure including the risks, benefits and alternatives for the proposed anesthesia with the patient or authorized representative who has indicated his/her understanding and acceptance.     Dental advisory given  Plan Discussed with: CRNA  Anesthesia Plan Comments:          Anesthesia Quick Evaluation

## 2024-07-03 NOTE — Discharge Instructions (Addendum)
 Your procedure went well. You had more than expected bleeding but it was controlled by the end of the surgery.   Please seek immediate care for heavy bleeding that soaks a pad.   You were prescribed Oxycodone , Ibuprofen  and Colace.    You will follow up in the office in 2-3 weeks for postoperative evaluation with Dr Eldonna at [facility]

## 2024-07-03 NOTE — Op Note (Addendum)
 Rudell Lisle PROCEDURE DATE: 07/03/2024  PREOPERATIVE DIAGNOSIS: 13 week missed abortion POSTOPERATIVE DIAGNOSIS: The same PROCEDURE: Dilation and Evacuation SURGEON: Suzen Maryan Masters, MD   INDICATIONS: 44 y.o. [phone] with MAB at [redacted] weeks gestation, needing surgical completion.  Risks of surgery were discussed with the patient including but not limited to: bleeding which may require transfusion; infection which may require antibiotics; injury to uterus or surrounding organs; need for additional procedures including laparotomy or laparoscopy; possibility of intrauterine scarring which may impair future fertility; and other postoperative/anesthesia complications. Written informed consent was obtained.    FINDINGS:  A 13 week size uterus, moderate amounts of products of conception, specimen sent to pathology.  ANESTHESIA: General-LMA, paracervical block performed by Suzen Maryan Masters . INTRAVENOUS FLUIDS:  1250 ml of LR ESTIMATED BLOOD LOSS:  SPECIMENS:  Products of conception sent to pathology and some products of conception were sent for [facility] genetic analysis COMPLICATIONS:  None immediate.  PROCEDURE DETAILS:  The patient received intravenous Doxycycline  while in the preoperative area.  She was then taken to the operating room where general anesthesia was administered and was found to be adequate.  After an adequate timeout was performed, she was placed in the dorsal lithotomy position and examined; then prepped and draped in the sterile manner.   Her bladder was catheterized for an unmeasured amount of clear, yellow urine. A vaginal speculum was then placed in the patient's vagina and a single tooth tenaculum was applied to the anterior lip of the cervix.  A paracervical block using 30 ml of 0.5% Marcaine  was administered. The cervix was gently dilated to accommodate a 12 mm suction curette that was gently advanced to the uterine fundus. The suction device was then  activated and curette slowly rotated to clear the uterus of products of conception. Several passes with the suction curettage were performed with good POCs but US  showed incomplete emptying. Sharp curettage was used and there was difficulty removing the calvarium. I used alligator forceps to grasp the fetus under US  guidance to remove the fetus intact. I then used the suction curettage to complete emptying of the uterus and US  used to confirm. There was brisk bleeding and pressure was applied with lap pads to the cervix and vigorous bimanual exam performed. Patient was also given methergine  int he OR.   By the end of the care, there was minimal bleeding noted and the tenaculum removed with good hemostasis noted.   All instruments were removed from the patient's vagina.  Sponge and instrument counts were correct times two  The patient tolerated the procedure well and was taken to the recovery area extubated, awake, and in stable condition.  Suzen Maryan Masters, MD, MPH, ABFM, [facility] Attending Physician Center for [facility]

## 2024-07-04 LAB — RPR: RPR Ser Ql: NONREACTIVE

## 2024-07-06 ENCOUNTER — Telehealth: Payer: Self-pay

## 2024-07-06 ENCOUNTER — Encounter (HOSPITAL_COMMUNITY): Payer: Self-pay | Admitting: Family Medicine

## 2024-07-06 LAB — SURGICAL PATHOLOGY

## 2024-07-06 NOTE — Telephone Encounter (Signed)
 Spoke w/ pt via interpreter services to notify of post op appt. Pt voiced understanding.

## 2024-07-06 NOTE — Anesthesia Postprocedure Evaluation (Signed)
 Anesthesia Post Note  Patient: Zamya Culhane  Procedure(s) Performed: DILATION AND EVACUATION, UTERUS (Vagina ) US  INTRAOPERATIVE (Abdomen)     Patient location during evaluation: PACU Anesthesia Type: General Level of consciousness: sedated and patient cooperative Pain management: pain level controlled Vital Signs Assessment: post-procedure vital signs reviewed and stable Respiratory status: spontaneous breathing Cardiovascular status: stable Anesthetic complications: no   No notable events documented.  Last Vitals:  Vitals:   07/03/24 1545 07/03/24 1600  BP: 118/84 113/77  Pulse: 80 82  Resp: 17 17  Temp:  (!) 36.3 C  SpO2: 96% 97%    Last Pain:  Vitals:   07/03/24 1600  TempSrc:   PainSc: 0-No pain                 Norleen Pope

## 2024-07-14 LAB — ANORA MISCARRIAGE TEST - FRESH

## 2024-07-27 ENCOUNTER — Encounter: Payer: Self-pay | Admitting: *Deleted

## 2024-07-27 ENCOUNTER — Ambulatory Visit (INDEPENDENT_AMBULATORY_CARE_PROVIDER_SITE_OTHER): Payer: Self-pay | Admitting: Family Medicine

## 2024-07-27 VITALS — BP 122/83 | HR 105 | Wt 207.0 lb

## 2024-07-27 DIAGNOSIS — O021 Missed abortion: Secondary | ICD-10-CM

## 2024-07-27 DIAGNOSIS — Z30011 Encounter for initial prescription of contraceptive pills: Secondary | ICD-10-CM

## 2024-07-27 DIAGNOSIS — Z9889 Other specified postprocedural states: Secondary | ICD-10-CM

## 2024-07-27 MED ORDER — NORGESTREL-ETHINYL ESTRADIOL 0.3-30 MG-MCG PO TABS: 1.0000 | ORAL_TABLET | Freq: Every day | ORAL | 4 refills | Status: AC

## 2024-07-27 NOTE — Progress Notes (Signed)
 Patient here for Post Op visit today following a D &E on 07/03/24.  Notes spotting here and there.

## 2024-07-27 NOTE — Progress Notes (Signed)
   GYNECOLOGY PROBLEM  VISIT ENCOUNTER NOTE  Subjective:   Christine Roach is a 43 y.o. [phone] female here for a problem GYN visit.  Current complaints: minor spotting, overall improving. Has not resumed menses.  D&E 9/12. Reviewed results -Anora with normal female results   Denies abnormal vaginal bleeding, discharge, pelvic pain, problems with intercourse or other gynecologic concerns.    Gynecologic History No LMP recorded.  Contraception: none  Health Maintenance Due  Topic Date Due   HEMOGLOBIN A1C  Never done   FOOT EXAM  Never done   OPHTHALMOLOGY EXAM  Never done   Diabetic kidney evaluation - Urine ACR  Never done   Hepatitis C Screening  Never done   Pneumococcal Vaccine (1 of 2 - PCV) Never done   Hepatitis B Vaccines 19-59 Average Risk (1 of 3 - 19+ 3-dose series) Never done   HPV VACCINES (1 - Risk 3-dose SCDM series) Never done   Cervical Cancer Screening (HPV/Pap Cotest)  10/04/2021   Mammogram  Never done   Influenza Vaccine  05/22/2024   COVID-19 Vaccine (4 - 2025-26 season) 06/22/2024    The following portions of the patient's history were reviewed and updated as appropriate: allergies, current medications, past family history, past medical history, past social history, past surgical history and problem list.  Review of Systems Pertinent items are noted in HPI.   Objective:  BP 122/83   Pulse (!) 105   Wt 207 lb (93.9 kg)   BMI 33.41 kg/m   Gen: well appearing, NAD HEENT: no scleral icterus CV: RR Lung: Normal WOB Ext: warm well perfused  Assessment and Plan:   1. Missed abortion with fetal demise before 20 completed weeks of gestation (Primary) Reviewed results and provided support Doing emotionally OK  Does not desire pregnancy in the future, this was unexpected.  2. S/P D&C (status post dilation and curettage)  3. Encounter for initial prescription of contraceptive pills Reviewed options in patient centered approach Finances is a  consideration, patient does not have insurance Reviewed local options for IUD at [facility] for low/no cost devices Reviewed also options for depo, pills, patch, ring through local [facility].  Discussed low cost options at pharmacies Would like to start OCPs - norgestrel-ethinyl estradiol (LO/OVRAL) 0.3-30 MG-MCG tablet; Take 1 tablet by mouth daily.  Dispense: 90 tablet; Refill: 4  Please refer to After Visit Summary for other counseling recommendations.   Return if contraceptive options are needed.  Suzen Maryan Masters, MD, MPH, ABFM Attending Physician Faculty Practice- Center for [facility]

## 2024-08-04 ENCOUNTER — Ambulatory Visit: Payer: Self-pay | Admitting: Family Medicine
